# Patient Record
Sex: Female | Born: 1985 | Race: White | Hispanic: No | Marital: Married | Smoking: Never smoker
Health system: Southern US, Community
[De-identification: ages and names within clinical notes are randomized; demographics above are authoritative.]

## PROBLEM LIST (undated history)

## (undated) DIAGNOSIS — I10 Essential (primary) hypertension: Secondary | ICD-10-CM

## (undated) DIAGNOSIS — Z9889 Other specified postprocedural states: Secondary | ICD-10-CM

## (undated) DIAGNOSIS — R112 Nausea with vomiting, unspecified: Secondary | ICD-10-CM

## (undated) HISTORY — PX: LAPAROSCOPIC GASTRIC SLEEVE RESECTION: SHX5895

## (undated) HISTORY — PX: ABDOMINAL HYSTERECTOMY: SHX81

---
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
## 2003-10-30 ENCOUNTER — Encounter: Admission: RE | Admit: 2003-10-30 | Discharge: 2003-10-30 | Payer: Self-pay | Admitting: Sports Medicine

## 2005-12-17 ENCOUNTER — Ambulatory Visit (HOSPITAL_COMMUNITY): Admission: RE | Admit: 2005-12-17 | Discharge: 2005-12-17 | Payer: Self-pay | Admitting: Obstetrics and Gynecology

## 2005-12-17 ENCOUNTER — Encounter (INDEPENDENT_AMBULATORY_CARE_PROVIDER_SITE_OTHER): Payer: Self-pay | Admitting: Specialist

## 2006-07-27 ENCOUNTER — Ambulatory Visit (HOSPITAL_COMMUNITY): Admission: RE | Admit: 2006-07-27 | Discharge: 2006-07-27 | Payer: Self-pay | Admitting: Obstetrics and Gynecology

## 2006-08-24 ENCOUNTER — Inpatient Hospital Stay (HOSPITAL_COMMUNITY): Admission: AD | Admit: 2006-08-24 | Discharge: 2006-08-24 | Payer: Self-pay | Admitting: Obstetrics and Gynecology

## 2006-08-24 ENCOUNTER — Ambulatory Visit (HOSPITAL_COMMUNITY): Admission: RE | Admit: 2006-08-24 | Discharge: 2006-08-24 | Payer: Self-pay | Admitting: Obstetrics and Gynecology

## 2006-09-25 ENCOUNTER — Inpatient Hospital Stay (HOSPITAL_COMMUNITY): Admission: AD | Admit: 2006-09-25 | Discharge: 2006-09-25 | Payer: Self-pay | Admitting: Obstetrics and Gynecology

## 2006-10-24 ENCOUNTER — Inpatient Hospital Stay (HOSPITAL_COMMUNITY): Admission: AD | Admit: 2006-10-24 | Discharge: 2006-10-24 | Payer: Self-pay | Admitting: Obstetrics and Gynecology

## 2006-11-16 ENCOUNTER — Inpatient Hospital Stay (HOSPITAL_COMMUNITY): Admission: AD | Admit: 2006-11-16 | Discharge: 2006-11-16 | Payer: Self-pay | Admitting: Obstetrics and Gynecology

## 2006-11-26 ENCOUNTER — Inpatient Hospital Stay (HOSPITAL_COMMUNITY): Admission: AD | Admit: 2006-11-26 | Discharge: 2006-11-26 | Payer: Self-pay | Admitting: *Deleted

## 2006-11-30 ENCOUNTER — Inpatient Hospital Stay (HOSPITAL_COMMUNITY): Admission: AD | Admit: 2006-11-30 | Discharge: 2006-12-03 | Payer: Self-pay | Admitting: Obstetrics & Gynecology

## 2006-12-05 ENCOUNTER — Encounter: Admission: RE | Admit: 2006-12-05 | Discharge: 2007-01-04 | Payer: Self-pay | Admitting: Obstetrics and Gynecology

## 2006-12-13 ENCOUNTER — Ambulatory Visit: Admission: RE | Admit: 2006-12-13 | Discharge: 2006-12-13 | Payer: Self-pay | Admitting: Obstetrics and Gynecology

## 2007-01-05 ENCOUNTER — Encounter: Admission: RE | Admit: 2007-01-05 | Discharge: 2007-01-19 | Payer: Self-pay | Admitting: Obstetrics and Gynecology

## 2008-04-11 ENCOUNTER — Inpatient Hospital Stay (HOSPITAL_COMMUNITY): Admission: AD | Admit: 2008-04-11 | Discharge: 2008-04-11 | Payer: Self-pay | Admitting: Obstetrics & Gynecology

## 2008-06-14 ENCOUNTER — Inpatient Hospital Stay (HOSPITAL_COMMUNITY): Admission: AD | Admit: 2008-06-14 | Discharge: 2008-06-14 | Payer: Self-pay | Admitting: Obstetrics and Gynecology

## 2008-06-26 ENCOUNTER — Inpatient Hospital Stay (HOSPITAL_COMMUNITY): Admission: AD | Admit: 2008-06-26 | Discharge: 2008-06-26 | Payer: Self-pay | Admitting: Obstetrics

## 2008-07-15 ENCOUNTER — Inpatient Hospital Stay (HOSPITAL_COMMUNITY): Admission: RE | Admit: 2008-07-15 | Discharge: 2008-07-17 | Payer: Self-pay | Admitting: Obstetrics and Gynecology

## 2008-07-18 ENCOUNTER — Encounter: Admission: RE | Admit: 2008-07-18 | Discharge: 2008-08-14 | Payer: Self-pay | Admitting: Obstetrics and Gynecology

## 2008-08-15 ENCOUNTER — Encounter: Admission: RE | Admit: 2008-08-15 | Discharge: 2008-08-19 | Payer: Self-pay | Admitting: Obstetrics and Gynecology

## 2010-06-21 ENCOUNTER — Encounter: Payer: Self-pay | Admitting: Obstetrics and Gynecology

## 2010-09-14 LAB — URINALYSIS, ROUTINE W REFLEX MICROSCOPIC
Hgb urine dipstick: NEGATIVE
Ketones, ur: NEGATIVE mg/dL
pH: 6 (ref 5.0–8.0)

## 2010-09-14 LAB — COMPREHENSIVE METABOLIC PANEL
AST: 20 U/L (ref 0–37)
Albumin: 2.7 g/dL — ABNORMAL LOW (ref 3.5–5.2)
BUN: 5 mg/dL — ABNORMAL LOW (ref 6–23)
Creatinine, Ser: 0.53 mg/dL (ref 0.4–1.2)
Total Bilirubin: 0.4 mg/dL (ref 0.3–1.2)
Total Protein: 6 g/dL (ref 6.0–8.3)

## 2010-09-14 LAB — CBC
MCHC: 32.6 g/dL (ref 30.0–36.0)
MCV: 82.5 fL (ref 78.0–100.0)
RBC: 4.31 MIL/uL (ref 3.87–5.11)
WBC: 11.3 10*3/uL — ABNORMAL HIGH (ref 4.0–10.5)

## 2010-09-14 LAB — URIC ACID: Uric Acid, Serum: 4.7 mg/dL (ref 2.4–7.0)

## 2010-09-15 LAB — COMPREHENSIVE METABOLIC PANEL
Alkaline Phosphatase: 120 U/L — ABNORMAL HIGH (ref 39–117)
Creatinine, Ser: 0.5 mg/dL (ref 0.4–1.2)
GFR calc non Af Amer: >60 mL/min (ref >60)
Potassium: 4.2 mEq/L (ref 3.5–5.1)
Sodium: 135 mEq/L (ref 135–145)

## 2010-09-15 LAB — CBC
Hemoglobin: 11.7 g/dL — ABNORMAL LOW (ref 12.0–15.0)
MCHC: 32.1 g/dL (ref 30.0–36.0)
MCHC: 32.9 g/dL (ref 30.0–36.0)
MCV: 80.4 fL (ref 78.0–100.0)
Platelets: 205 10*3/uL (ref 150–400)
Platelets: 183 10*3/uL (ref 150–400)
RBC: 4.06 MIL/uL (ref 3.87–5.11)
RDW: 15.4 % (ref 11.5–15.5)
WBC: 12 10*3/uL — ABNORMAL HIGH (ref 4.0–10.5)

## 2010-09-15 LAB — RH IMMUNE GLOB WKUP(>/=20WKS)(NOT WOMEN'S HOSP): Fetal Screen: NEGATIVE

## 2010-09-15 LAB — URIC ACID: Uric Acid, Serum: 5.5 mg/dL (ref 2.4–7.0)

## 2010-10-13 NOTE — Consult Note (Signed)
[name], [name]             ACCOUNT NO.:  [id number]   MEDICAL RECORD NO.:  [id number]          PATIENT TYPE:  MAT   LOCATION:  [facility]                          [facility]:  [facility]   PHYSICIAN:  Lenoard Aden, M.D.DATE OF BIRTH:  Aug 06, 1985   DATE OF CONSULTATION:  DATE OF DISCHARGE:                                 CONSULTATION   EMERGENCY ROOM CONSULTATION:   CHIEF COMPLAINT:  Questionable leakage of fluid.   She is a 25 year old white female, G2, P0, at 35 plus weeks who presents  with questionable leakage of fluid noted this past evening.   ALLERGIES:  She has no know drug allergies.   MEDICATIONS:  1. Prenatal vitamins.  2. Diamox for pseudotumor cerebri.   MEDICAL PROBLEMS:  1. Endometriosis.  2. Pseudotumor cerebri which was diagnosed during pregnancy, given her      headaches; blood pressure has not been stable, headaches have      improved.   PRENATAL COURSE:  Complicated by multiple ultrasounds to follow her AFI  which has been normal and elevated blood pressures with no evidence of  preeclampsia.   PHYSICAL EXAMINATION:  GENERAL:  On exam today, she is a well-developed,  well-nourished, white female in no acute distress.  VITAL SIGNS:  Blood pressure is ranging from 145-157/87-93.  Negative  for proteinuria on urine dip.  HEENT:  Normal.  NECK:  Supple.  Full range of motion.  LUNGS:  Clear.  HEART:  Regular rate and rhythm.  ABDOMEN:  Soft, gravid, nontender.  No right upper quadrant tenderness.  EXTREMITIES:  DTRs 2+.  No evidence of clonus.  Trace pretibial edema  noted.  NEUROLOGIC:  Nonfocal.  SKIN:  Intact.  PELVIC:  Cervix is closed, 50%, vertex, -1.  Negative fern, negative  Nitrazine, negative pooling noted.   NST is reactive.  Rare contractions are noted.  PIH panel completely  within normal limits with normal CBC and comprehensive metabolic panel.   IMPRESSION:  1. Thirty-five week intrauterine pregnancy.  2. Pseudotumor cerebri with  persistent elevated blood pressures, no      evidence of preeclampsia.   PLAN:  Discharge home.  Follow up in the office as scheduled.  Preeclampsia precautions given.      Lenoard Aden, M.D.  Electronically Signed     RJT/MEDQ  D:  11/16/2006  T:  11/16/2006  Job:  [id number]

## 2010-10-13 NOTE — Consult Note (Signed)
[name], [name]             ACCOUNT NO.:  [id number]   MEDICAL RECORD NO.:  [id number]          PATIENT TYPE:  MAT   LOCATION:  [facility]                          [facility]   PHYSICIAN:  Lenoard Aden, M.D.DATE OF BIRTH:  09-May-1986   DATE OF CONSULTATION:  DATE OF DISCHARGE:                                 CONSULTATION   CHIEF COMPLAINT:  Headaches and elevated blood pressure as taken at the  fire station.   HISTORY:  She is a 25 year old white female G2, P0 at [redacted] weeks gestation  with a history of elevated blood pressures and pseudotumor cerebri who  presents with increased headache this evening which is frontal and  temporal.  She has a history of headaches from her pseudotumor cerebri  which does not appear recurrent.  She denies nausea or vomiting.  She  denies epigastric pain or any visual changes.  She reports good fetal  movement.  She denies bleeding or leakage of fluid.   ALLERGIES:  SHE HAS NO KNOWN DRUG ALLERGIES.   MEDICATIONS:  Prenatal vitamins and Diazoxide.   PAST MEDICAL HISTORY:  Remarkable for 2 pseudotumor cerebri, history of  kidney stones, childhood asthma, migraines.  She has a history of  laparoscopy x2, history of knee surgery x2.   OBSTETRIC HISTORY:  Miscarriage x1.  She denies domestic or physical  violence.   FAMILY HISTORY:  She has a family history of hypertension,  hypothyroidism, COPD, Parkinson's disease.   PHYSICAL EXAM:  VITAL SIGNS:  Initial blood pressure 167/101, repeat  blood pressure as low as 129/81.  HEENT:  Normal.  LUNGS:  Clear.  HEART:  Regular rate and rhythm.  ABDOMEN:  Soft, gravida, nontender.  No CVA tenderness.  SKIN:  Intact.  NEUROLOGIC EXAM:  Nonfocal.  PELVIC EXAM:  Deferred.  EXTREMITIES:  There are no cords.  No edema.  DTRs 2+.  No evidence of  clonus.  NEUROLOGIC EXAM:  Nonfocal.   LABORATORY DATA:  NST is reactive.  PIH labs within normal limits.   IMPRESSION:  1. Thirty-two-week OB.  2.  History of chronic hypertension versus pregnancy-induced      hypertension.  No evidence of preeclampsia with negative urine,      normal labs and resolution of elevated blood pressure today.   PLAN:  Given Tylenol, discharged home, preeclampsia precautions given,  follow-up in the office as scheduled for weekly amniotic fluid index.      Lenoard Aden, M.D.  Electronically Signed     RJT/MEDQ  D:  10/24/2006  T:  10/24/2006  Job:  [id number]

## 2010-10-13 NOTE — H&P (Signed)
[name], [name]             ACCOUNT NO.:  [id number]   MEDICAL RECORD NO.:  [id number]          PATIENT TYPE:  MAT   LOCATION:  [facility]                          [facility]   PHYSICIAN:  Lenoard Aden, M.D.DATE OF BIRTH:  31-Jan-1986   DATE OF ADMISSION:  06/14/2008  DATE OF DISCHARGE:  06/14/2008                              HISTORY & PHYSICAL   CHIEF COMPLAINT:  Contractions.   She is a 25 year old white female G3, P1, currently at 21 weeks'  gestation, who presents with complaints of contractions and a history of  mildly elevated blood pressure, but no elevation today.   MEDICATIONS:  Prenatal vitamins.   SOCIAL HISTORY:  She is a nonsmoker and nondrinker.  She denies domestic  or physical violence.   FAMILY HISTORY:  Hypertension, thyroid disease, Parkinson disease,  leukemia.   Previous pregnancy, 7-pound 12-ounce child born at term and a history of  FAD in 2007.  She has had laparoscopy x2, hernia surgery, history of  shoulder surgery and history of knee surgery.  Prenatal course of the  baby was uncomplicated.   PHYSICAL EXAMINATION:  GENERAL:  She is a well-developed, well-nourished  white female in no acute distress.  VITAL SIGNS:  Blood pressure 127/80.  HEENT:  Normal.  LUNGS:  Clear.  NECK:  Supple with full range of motion.  HEART:  Regular rate and rhythm.  ABDOMEN:  Soft, gravid, nontender.  No CVA tenderness.  Cervix is  closed, layered, vertex, and -2.  EXTREMITIES:  There are no cords.  NEUROLOGIC:  Nonfocal.  SKIN:  Intact.   NST is reactive.  No regular contractions are noted.  PIH panel drawn  because of a history of mildly elevated blood pressure, was within  normal limits.  Urinalysis was negative.   IMPRESSION:  1. A 35-week OB.  2. No evidence of preterm labor.  3. Headache of unspecified origin, no evidence of preeclampsia.   PLAN:  Given the vitamin x1, discharged home, preterm labor, will follow  up in the office for scheduled  workup.      Lenoard Aden, M.D.  Electronically Signed     RJT/MEDQ  D:  06/16/2008  T:  06/16/2008  Job:  [id number]

## 2010-10-13 NOTE — H&P (Signed)
[name], [name]             ACCOUNT NO.:  [id number]   MEDICAL RECORD NO.:  [id number]          PATIENT TYPE:  INP   LOCATION:  9108                          [facility]   PHYSICIAN:  Lenoard Aden, M.D.DATE OF BIRTH:  08-28-1985   DATE OF ADMISSION:  07/15/2008  DATE OF DISCHARGE:                              HISTORY & PHYSICAL   CHIEF COMPLAINT:  A 39 weeks pseudotumor cerebri with new-onset  worsening headaches for induction of labor favorable cervix.   HISTORY OF PRESENT ILLNESS:  She is a 25 year old white female G3, P1 at  39 weeks with a history of pseudotumor cerebri, currently on no  medications who presents now 39 weeks with favorable cervix for  induction.  She has no known drug allergies, medications, or prenatal  vitamins.  She is a nonsmoker, nondrinker.  She denies domestic or  physical violence.   MEDICAL PROBLEMS:  Pseudotumor cerebri as noted, kidney stones, optic  nerve swelling, low-grade SIL, asthma, migraine headaches, and history  of endometriosis.   FAMILY HISTORY:  Hypertension, COPD, thyroid disease, Parkinson's,  kidney stones, lymphoma, leukemia, history of 7 pounds 12 ounces female,  and 4-week SAB.   PHYSICAL EXAMINATION:  GENERAL:  She is a well-developed, well-nourished  white female in no acute distress.  HEENT:  Normal.  LUNGS:  Clear.  HEART:  Regular rhythm.  ABDOMEN:  Soft, gravid, nontender.  Estimated fetal weight of 8 pounds.  Cervix is 2-3 cm, 70% vertex -1.  EXTREMITIES:  No cords.  NEUROLOGIC:  Nonfocal.  SKIN:  Intact.   IMPRESSION:  1. A 39-week intrauterine pregnancy.  2. Symptomatic pseudotumor cerebri.  3. Elevated blood pressure due to symptomatic pseudotumor cerebri.  4. No evidence of preeclampsia.   PLAN:  Proceed with induction.      Lenoard Aden, M.D.  Electronically Signed    RJT/MEDQ  D:  07/15/2008  T:  07/16/2008  Job:  [id number]

## 2010-10-16 NOTE — Consult Note (Signed)
NAMEZONDRA, LAWLOR NO.:  [id number]   MEDICAL RECORD NO.:  [id number]         PATIENT TYPE:  [facility]   LOCATION:                                [facility]   PHYSICIAN:  Lenoard Aden, M.D.     DATE OF BIRTH:   DATE OF CONSULTATION:  09/25/2006  DATE OF DISCHARGE:                                 CONSULTATION   CHIEF COMPLAINT:  This is a 25 year old white female, G2, P0 with a  history of pseudotumor cerebri currently on Diamox with pregnancy who  presents for followup __________  last 5 hours.   Hypothyroidism, Parkinson's disease, __________.   She has no known drug allergies.   Prenatal course was complicated by diagnosis of pseudotumor cerebri and  started on Diamox.   Today her blood pressure is 120/60 after being 140/54.  HEENT:  Normal.  NECK:  Supple.  Full range of motion.  LUNGS: Clear.  HEART:  Regular rate and rhythm.  ABDOMEN:  Soft, gravid, nontender.  CERVICAL:  Exam is deferred.  EXTREMITIES:  No cords.  NEUROLOGIC:  Nonfocal.  SKIN:  Intact.   NST:  Reassuring.  __________. No contractions, no decelerations noted.   IMPRESSION:  1. A 27-week pregnancy.  2. Reassuring fetal heart rate with notable fetal movement while      auscultating fetal heart beat.  3. Borderline hypertension. Stable.   PLAN:  Follow up in the office as scheduled.      Lenoard Aden, M.D.  Electronically Signed     RJT/MEDQ  D:  09/25/2006  T:  09/25/2006  Job:  [id number]

## 2010-10-16 NOTE — Op Note (Signed)
NAMESHAVONA, GUNDERMAN             ACCOUNT NO.:  [id number]   MEDICAL RECORD NO.:  [id number]          PATIENT TYPE:  AMB   LOCATION:  [facility]                           [facility]   PHYSICIAN:  Lenoard Aden, M.D.DATE OF BIRTH:  June 04, 1985   DATE OF PROCEDURE:  12/17/2005  DATE OF DISCHARGE:                                 OPERATIVE REPORT   PREOPERATIVE DIAGNOSIS:  Right lower quadrant pain.   POSTOPERATIVE DIAGNOSIS:  Pelvic endometriosis.   PROCEDURE:  Diagnostic laparoscopy ablation of cul de sac endometriosis and  endometriosis in the right and left ovarian fossa.   SURGEON:  Lenoard Aden, M.D.   ANESTHESIA:  General.   ESTIMATED BLOOD LOSS:  50 cc.   COMPLICATIONS:  None.   DRAINS:  None.   COUNTS:  Correct.   Patient to recovery room in good condition.   OPERATIVE NOTE:  After being apprised of the risks of anesthesia, infection,  and bleeding, injury to abdominal organs, need for repair, delayed  __________ including bowel and bladder injury, patient taken to the  operating room.  She is administered a general anesthetic without  complications, prepped and draped in the usual sterile fashion.  Foley  catheter placed.  After achieving adequate anesthesia, dilute Marcaine  solution placed.  An infraumbilical incision made with a scalpel after a  Hulka tenaculum placed per vagina.  After achievement of an infraumbilical  incision, Veress needle placed.  Opening pressure -1 noted.  Four liters of  CO2 insufflated without difficulty.  Trocar placed.  Atraumatic trocar entry  visualized.  Normal liver, gallbladder bed.  Normal appendiceal area.  Normal uterus.  Normal anterior cul de sac.  Normal left and right ovary.  Normal tubes.  Posterior cul de sac has multiple surface endometrial  implants, mostly clear with some reddish, powdery burn-type implants as  well.  Ureters are identified bilaterally.  A second 5 mm set is entered in  the left lower quadrant.   All implants are visualized and under direct  visualization are cauterized bipolar with good resolution of the implants  along the posterior cul de sac, uterosacral ligaments, right and left  ovarian fossa with special attention paid to the ureters during the process  at this time.  Good, adequate ablation of all implants is noted.  Good  hemostasis noted.  All  instruments are removed under direct visualization.  CO2 released.  The  incision is closed using 0 Vicryl and Dermabond.  The patient tolerated the  procedure well.  The instruments are removed from the vagina.  The Foley  removed.  Patient is transferred to the recovery room in good condition.      Lenoard Aden, M.D.  Electronically Signed     RJT/MEDQ  D:  12/17/2005  T:  12/17/2005  Job:  [id number]

## 2010-10-16 NOTE — Consult Note (Signed)
Tamara [name], [name]             ACCOUNT NO.:  [id number]   MEDICAL RECORD NO.:  [id number]          PATIENT TYPE:  MAT   LOCATION:  [facility]                          [facility]   PHYSICIAN:  Richardean Sale, M.D.   DATE OF BIRTH:  November 18, 1985   DATE OF CONSULTATION:  08/24/2006  DATE OF DISCHARGE:                                 CONSULTATION   CHIEF COMPLAINT:  Headache and elevated blood pressure.   HISTORY OF PRESENT ILLNESS:  This is a 25 year old, gravida 2, para 0-0-  1-0,  white female who is at [redacted] weeks gestation with a due date of December 18, 2006, who was seen this morning at the [facility]  center for an ultrasound and was found to have a blood pressure of  160/110.  The patient complained of a headache and was subsequently sent  to maternity admissions for evaluation.  The patient's pregnancy is  complicated by pseudotumor cerebri.  She has had significant headaches  as well as double vision during this pregnancy.  She has been seen by an  ophthalmologist in [place] and is currently on Diamox.  The  patient is undergoing weekly ultrasound for amniotic fluid volume  through the center for maternal fetal care.  The patient states this  headache is on the left side.  It is not like the headache she has had  from the pseudotumor cerebri, but could possibly be a migraine.  She  denies any nausea or vomiting.  No epigastric pain and no new visual  changes.  She reports good movement, denies any loss of fluid or vaginal  bleeding.  Complains of a little bit of pelvic cramping but no  contractions.   Pren[facility] care has been at [facility] with Dr. Billy Coast as primary  attending.   PAST MEDICAL HISTORY:  1. Pseudotumor cerebri.  2. History of kidney stone.  3. Childhood asthma.  4. Migraines.   PAST SURGICAL HISTORY:  1. Laparoscopy x2.  2. Knee surgery x2.   GYNECOLOGIC HISTORY:  1. Endometriosis.  2. Low-grade dysplasia on Pap smear, no treatment  needed.   Denies any sexually transmitted infections.   OBSTETRIC HISTORY:  Miscarriage x1.   SOCIAL HISTORY:  She is married.  Denies alcohol, tobacco, or drug use.   FAMILY HISTORY:  Positive for chronic hypertension, COPD,  hypothyroidism, Parkinson's disease.  No birth defects, congenital  anomalies, Down's syndrome, or spina bifida.   MEDICATIONS:  Diamox 1000 mg daily and prenatal vitamins.   ALLERGIES:  No known drug allergies.   PHYSICAL EXAMINATION:  VITAL SIGNS:  She is afebrile.  Vital signs ae  stable.  Blood pressure 148/84 upon arrival to maternity admissions.  GENERAL:  She is a well-developed, well-nourished white female who  appears in no acute distress.  HEART:  Regular rate and rhythm.  LUNGS:  Clear to auscultation bilaterally.  ABDOMEN:  Gravid, soft, nontender, appears AGA.  EXTREMITIES:  No cyanosis, clubbing, or edema.  Nontender.  NEUROLOGIC:  Exam is nonfocal.  Deep tendon reflexes are 1+ bilaterally  with no clonus.   ASSESSMENT:  A 25 year old  white female at [redacted] weeks gestation with known  pseudotumor cerebri, now with elevation of blood pressure.   PLAN:  1. Will check preeclampsia labs and urinalysis for protein as well as      serial blood pressures with admission pending results.  2. Pseudotumor cerebri.  Continue Diamox 1000 mg daily and check      ultrasound today for amniotic fluid volume.      Richardean Sale, M.D.  Electronically Signed     JW/MEDQ  D:  08/24/2006  T:  08/24/2006  Job:  [id number]

## 2010-10-16 NOTE — Op Note (Signed)
[name], [name]             ACCOUNT NO.:  [id number]   MEDICAL RECORD NO.:  [id number]          PATIENT TYPE:  AMB   LOCATION:  [facility]                           [facility]   PHYSICIAN:  Lenoard Aden, M.D.DATE OF BIRTH:  April 22, 1986   DATE OF PROCEDURE:  12/17/2005  DATE OF DISCHARGE:  12/17/2005                                 OPERATIVE REPORT   ADDENDUM:  Please add to the:   PREOPERATIVE DIAGNOSIS:  Desire for intrauterine device removal.   POSTOPERATIVE DIAGNOSIS:  Desire for intrauterine device removal.   PROCEDURE:  Intrauterine device removal.   ADDENDUM TO THE PROCEDURE NOTE:  After achieving adequate anesthesia, the  patient was prepped and draped in the usual sterile fashion.  IUD string was  visualized and removed.  A Mirena IUD was removed without difficulty.  The  patient tolerated the procedure well under anesthesia and the rest of the  operation was performed as previously dictated.      Lenoard Aden, M.D.  Electronically Signed     RJT/MEDQ  D:  02/21/2006  T:  02/23/2006  Job:  [id number]

## 2011-03-16 LAB — CBC
HCT: 33.7 — ABNORMAL LOW
Hemoglobin: 11.3 — ABNORMAL LOW
Hemoglobin: 12.8
MCHC: 33
MCHC: 34.3
MCV: 83.4
Platelets: 220
Platelets: 235
Platelets: 258
RDW: 13.8
WBC: 11.3 — ABNORMAL HIGH
WBC: 20 — ABNORMAL HIGH

## 2011-03-16 LAB — COMPREHENSIVE METABOLIC PANEL
ALT: 15
Albumin: 2.2 — ABNORMAL LOW
BUN: 6
CO2: 22
Calcium: 8.9
GFR calc Af Amer: >60
GFR calc non Af Amer: >60
Potassium: 2.9 — ABNORMAL LOW
Total Bilirubin: 0.5
Total Protein: 5.1 — ABNORMAL LOW

## 2011-03-16 LAB — LACTATE DEHYDROGENASE: LDH: 162

## 2011-03-16 LAB — CCBB MATERNAL DONOR DRAW

## 2011-03-17 LAB — URINALYSIS, ROUTINE W REFLEX MICROSCOPIC
Bilirubin Urine: NEGATIVE
Glucose, UA: NEGATIVE
Ketones, ur: NEGATIVE
Nitrite: NEGATIVE
Protein, ur: NEGATIVE
pH: 5.5

## 2011-03-17 LAB — COMPREHENSIVE METABOLIC PANEL
Albumin: 2.8 — ABNORMAL LOW
BUN: 8
Calcium: 9.2
Glucose, Bld: 85
Total Protein: 6.3

## 2011-03-17 LAB — CBC
HCT: 38.3
Hemoglobin: 13.1
MCHC: 34.3
Platelets: 239
RDW: 13.7

## 2011-03-17 LAB — LACTATE DEHYDROGENASE: LDH: 141

## 2011-03-17 LAB — URIC ACID: Uric Acid, Serum: 4.7

## 2013-08-07 ENCOUNTER — Other Ambulatory Visit: Payer: Self-pay | Admitting: Orthopedic Surgery

## 2013-08-07 DIAGNOSIS — M25561 Pain in right knee: Secondary | ICD-10-CM

## 2013-08-12 ENCOUNTER — Other Ambulatory Visit: Payer: Self-pay

## 2017-09-12 ENCOUNTER — Encounter: Payer: Self-pay | Admitting: *Deleted

## 2017-09-12 ENCOUNTER — Emergency Department
Admission: EM | Admit: 2017-09-12 | Discharge: 2017-09-12 | Disposition: A | Discharge status: Home / Self Care | Payer: BLUE CROSS/BLUE SHIELD | Source: Home / Self Care | Attending: Family Medicine | Admitting: Family Medicine

## 2017-09-12 ENCOUNTER — Other Ambulatory Visit: Payer: Self-pay

## 2017-09-12 ENCOUNTER — Emergency Department (INDEPENDENT_AMBULATORY_CARE_PROVIDER_SITE_OTHER): Payer: BLUE CROSS/BLUE SHIELD

## 2017-09-12 DIAGNOSIS — M5442 Lumbago with sciatica, left side: Secondary | ICD-10-CM

## 2017-09-12 DIAGNOSIS — M5416 Radiculopathy, lumbar region: Secondary | ICD-10-CM

## 2017-09-12 MED ORDER — PREDNISONE 20 MG PO TABS: ORAL_TABLET | ORAL | 0 refills | Status: DC

## 2017-09-12 MED ORDER — KETOROLAC TROMETHAMINE 60 MG/2ML IM SOLN
60.0000 mg | Freq: Once | INTRAMUSCULAR | Status: AC
  Administered 2017-09-12: 60 mg via INTRAMUSCULAR

## 2017-09-12 MED ORDER — HYDROCODONE-ACETAMINOPHEN 5-325 MG PO TABS: 1.0000 | ORAL_TABLET | Freq: Four times a day (QID) | ORAL | 0 refills | Status: AC | PRN

## 2017-09-12 MED ORDER — CYCLOBENZAPRINE HCL 10 MG PO TABS: 10.0000 mg | ORAL_TABLET | Freq: Two times a day (BID) | ORAL | 1 refills | Status: DC | PRN

## 2017-09-12 NOTE — ED Triage Notes (Signed)
Patient reports yesterday while doing laundry in a twisting motion injured her lower left back with pain in her left glute and leg. No previous injury. Taking tylenol without relief.

## 2017-09-12 NOTE — Discharge Instructions (Addendum)
Apply ice pack for 20 to 30 minutes, 3 to 4 times daily  Continue until pain and swelling decrease.  Begin range of motion and stretching exercises as tolerated. 

## 2017-09-12 NOTE — ED Provider Notes (Signed)
[name]    CSN: [id number] Arrival date & time: 09/12/17  0827     History   Chief Complaint Chief Complaint  Patient presents with  . Back Pain    HPI [name] is a 32 y.o. female.   While doing laundry yesterday, patient twisted her back and felt sudden sharp pain in her left lower back that radiated to her left lateral thigh.  The pain has persisted.   She denies bowel or bladder dysfunction, and no saddle numbness.  She has a past history of cervical spine degenerative changes.  The history is provided by the patient and the spouse.  Back Pain  Location:  Sacro-iliac joint Quality:  Aching, shooting and stiffness Stiffness is present:  All day Radiates to:  L thigh Pain severity:  Severe Pain is:  Same all the time Onset quality:  Sudden Duration:  1 day Timing:  Constant Progression:  Unchanged Chronicity:  New Context: lifting heavy objects and twisting   Context: not recent illness and not recent injury   Relieved by:  Nothing Worsened by:  Ambulation and bending Ineffective treatments:  Heating pad and OTC medications Associated symptoms: no abdominal pain, no abdominal swelling, no bladder incontinence, no bowel incontinence, no chest pain, no dysuria, no fever, no leg pain, no numbness, no paresthesias, no pelvic pain, no perianal numbness, no tingling, no weakness and no weight loss     History reviewed. No pertinent past medical history.  There are no active problems to display for this patient.   Past Surgical History:  Procedure Laterality Date  . ABDOMINAL HYSTERECTOMY    . LAPAROSCOPIC GASTRIC SLEEVE RESECTION      OB History   None      Home Medications    Prior to Admission medications   Medication Sig Start Date End Date Taking? Authorizing Provider  cyanocobalamin (,VITAMIN B-12,) 1000 MCG/ML injection Inject into the muscle. 04/01/15  Yes [provider]  Multiple Vitamin (MULTIVITAMIN) tablet Take  1 tablet by mouth daily.   Yes [provider]  cyclobenzaprine (FLEXERIL) 10 MG tablet Take 1 tablet (10 mg total) by mouth 2 (two) times daily as needed for muscle spasms. 09/12/17   Kandra Nicolas, MD  HYDROcodone-acetaminophen (NORCO/VICODIN) 5-325 MG tablet Take 1 tablet by mouth every 6 (six) hours as needed for up to 5 days for moderate pain. 09/12/17 09/17/17  Kandra Nicolas, MD  predniSONE (DELTASONE) 20 MG tablet Take one tab by mouth twice daily for 4 days, then one daily for 3 days. Take with food. 09/12/17   Kandra Nicolas, MD    Family History Family History  Problem Relation Age of Onset  . Hypertension Mother   . Hypertension Father     Social History Social History   Tobacco Use  . Smoking status: Never Smoker  . Smokeless tobacco: Never Used  Substance Use Topics  . Alcohol use: Never    Frequency: Never  . Drug use: Never     Allergies   Aspartame and Ibuprofen   Review of Systems Review of Systems  Constitutional: Negative for fever and weight loss.  Cardiovascular: Negative for chest pain.  Gastrointestinal: Negative for abdominal pain and bowel incontinence.  Genitourinary: Negative for bladder incontinence, dysuria and pelvic pain.  Musculoskeletal: Positive for back pain.  Neurological: Negative for tingling, weakness, numbness and paresthesias.  All other systems reviewed and are negative.    Physical Exam Triage Vital Signs ED Triage Vitals  Enc Vitals Group     BP      Pulse      Resp      Temp      Temp src      SpO2      Weight      Height      Head Circumference      Peak Flow      Pain Score      Pain Loc      Pain Edu?      Excl. in [place]?    No data found.  Updated Vital Signs BP 131/89 (BP Location: Right Arm)   Pulse 96   Ht 5\' 4"  (1.626 m)   Wt 142 lb (64.4 kg)   SpO2 100%   BMI 24.37 kg/m   Visual Acuity Right Eye Distance:   Left Eye Distance:   Bilateral Distance:    Right Eye Near:   Left Eye  Near:    Bilateral Near:     Physical Exam  Constitutional: She appears well-developed and well-nourished. No distress.  HENT:  Head: Normocephalic.  Eyes: Pupils are equal, round, and reactive to light. Conjunctivae are normal.  Neck: Normal range of motion.  Cardiovascular: Normal heart sounds.  Pulmonary/Chest: Breath sounds normal.  Abdominal: There is no tenderness.  Musculoskeletal: She exhibits no edema.       Lumbar back: She exhibits decreased range of motion, tenderness and bony tenderness. She exhibits no swelling.       Back:  Back:   Can heel/toe walk and squat without difficulty.  Decreased range of motion.  Tenderness over the left SI joint. Straight leg raising test is positive on the left at about 30 degrees.  Sitting knee extension test is positive on the left at full knee extension.  Strength and sensation in the lower extremities is normal.  Patellar and achilles reflexes are normal   Neurological: She is alert.  Skin: Skin is warm and dry.  Nursing note and vitals reviewed.    UC Treatments / Results  Labs (all labs ordered are listed, but only abnormal results are displayed) Labs Reviewed - No data to display  EKG None Radiology Dg Lumbar Spine Complete  Result Date: 09/12/2017 CLINICAL DATA:  Acute onset low back pain on the left radiating into the left leg while doing laundry yesterday. No known injury. EXAM: LUMBAR SPINE - COMPLETE 4+ VIEW COMPARISON:  None. FINDINGS: There is no evidence of lumbar spine fracture. Alignment is normal. Intervertebral disc spaces are maintained. IMPRESSION: Normal exam. Electronically Signed   By: Inge Rise M.D.   On: 09/12/2017 09:30    Procedures Procedures (including critical care time)  Medications Ordered in UC Medications  ketorolac (TORADOL) injection 60 mg (60 mg Intramuscular Given 09/12/17 0903)     Initial Impression / Assessment and Plan / UC Course  I have reviewed the triage vital signs and the  nursing notes.  Pertinent labs & imaging results that were available during my care of the patient were reviewed by me and considered in my medical decision making (see chart for details).    Begin prednisone burst/taper, Flexeril, and Lortab. Controlled Substance Prescriptions I have consulted the [place] Controlled Substances Registry for this patient, and feel the risk/benefit ratio today is favorable for proceeding with this prescription for a controlled substance.  Apply ice pack for 20 to 30 minutes, 3 to 4 times daily  Continue until pain and swelling decrease.  Begin range of  motion and stretching exercises as tolerated. Followup with Dr. Aundria Mems or Dr. Lynne Leader ([facility]) if not improving about two weeks.     Final Clinical Impressions(s) / UC Diagnoses   Final diagnoses:  Acute left-sided low back pain with left-sided sciatica    ED Discharge Orders        Ordered    HYDROcodone-acetaminophen (NORCO/VICODIN) 5-325 MG tablet  Every 6 hours PRN     09/12/17 1003    cyclobenzaprine (FLEXERIL) 10 MG tablet  2 times daily PRN     09/12/17 1003    predniSONE (DELTASONE) 20 MG tablet     09/12/17 1003          Kandra Nicolas, MD 09/12/17 1008

## 2019-11-09 ENCOUNTER — Encounter (HOSPITAL_BASED_OUTPATIENT_CLINIC_OR_DEPARTMENT_OTHER): Payer: Self-pay | Admitting: Orthopedic Surgery

## 2019-11-09 ENCOUNTER — Ambulatory Visit (INDEPENDENT_AMBULATORY_CARE_PROVIDER_SITE_OTHER): Payer: BC Managed Care – PPO | Admitting: Orthopedic Surgery

## 2019-11-09 ENCOUNTER — Other Ambulatory Visit: Payer: Self-pay

## 2019-11-09 ENCOUNTER — Encounter: Payer: Self-pay | Admitting: Orthopedic Surgery

## 2019-11-09 DIAGNOSIS — M25862 Other specified joint disorders, left knee: Secondary | ICD-10-CM

## 2019-11-09 NOTE — Progress Notes (Signed)
Office Visit Note   Patient: [name]           Date of Birth: [birth year]           MRN: [id number] Visit Date: 11/09/2019 Requested by: Louis Meckel, PA-C [place],  [place] [id number] PCP: Louis Meckel, PA-C  Subjective: Chief Complaint  Patient presents with  . Left Knee - Pain    HPI: [name] is a 34 year old patient with left knee pain.'s been going on for 2 years.  Denies any specific injury.  Been much worse over the past 4 months.  She has real difficulty and pain with flexion anywhere near 90 degrees.  Describes the pain is level 8 out of 10.  Used to bike 55 miles a week but she has unable to do that since February.  She has had 2 cortisone shots one this year one last year.  They did not help.  Both cortisone injections were performed from anterior medial approach.  Denies any groin pain or back pain.  Denies any numbness and tingling.  Localizes the pain just on the medial aspect of the patellar tendon.  Does report some stiffness at the end of the day.  She has tried Voltaren gel as well as CBD cream and Tylenol and ibuprofen.  Also tried physical therapy which out any relief.  She has had right knee ACL reconstruction and some type of medial patella stabilizing procedure and lateral release on the right-hand side.  She states that this pain in the knee does not feel like any of the pathology she has experienced before with her ACL tear and patellar instability.  MRI scan accompanies the patient.  It is reviewed frame by frame coronal sagittal and axial views.  Menisci look intact.  Joint surfaces look intact.  ACL and PCL look intact.  There is abnormality of the retropatellar fat pad noted most prominently on the T2 sagittals.  It appears that there may be a focal lipoma or well-circumscribed soft tissue trapezoidal shape tissue in the intercondylar notch.  Fat density is the same as the fat pad on the T1 sagittals.  There is  definite well-circumscribed fatty lesion within the retrofat pad seen on the T2 sagittals.              ROS: All systems reviewed are negative as they relate to the chief complaint within the history of present illness.  Patient denies  fevers or chills.   Assessment & Plan: Visit Diagnoses:  1. Cyst of left knee joint     Plan: Impression is pretty debilitating right knee pain with no evidence of referred pain from the back or hip.  She does have on MRI review a potential abnormality in the retropatellar fat pad.  This is not definite and was not mentioned by the radiologist and all this is discussed with her.  Nonetheless I think this is really the only thing that makes potentially any sense with her clinical presentation.  Discussed with her operative and nonoperative treatment options for this problem.  She has really exhausted nonoperative treatment options.  I am not sure that with that approach for an injection anteromedial that the medication was delivered into the joint.  This is particularly true based on her pretty significant pain for 4 to 5 days after the injection.  Nonetheless I think arthroscopy and retropatellar debridement as well as tissue sampling is indicated.  Nothing really about this appears malignant based  on the scans.  The risk benefits of the procedure are discussed including the potential for no improvement.  Patient understands and wishes to proceed.  All questions answered no personal or family history of DVT or pulmonary embolism.  Follow-Up Instructions: No follow-ups on file.   Orders:  No orders of the defined types were placed in this encounter.  No orders of the defined types were placed in this encounter.     Procedures: No procedures performed   Clinical Data: No additional findings.  Objective: Vital Signs: There were no vitals taken for this visit.  Physical Exam:   Constitutional: Patient appears well-developed HEENT:  Head:  Normocephalic Eyes:EOM are normal Neck: Normal range of motion Cardiovascular: Normal rate Pulmonary/chest: Effort normal Neurologic: Patient is alert Skin: Skin is warm Psychiatric: Patient has normal mood and affect    Ortho Exam: Ortho exam demonstrates full active and passive range of motion of the right knee.  Left knee demonstrates pain with flexion past 90.  Collateral cruciate ligaments are stable.  Does have tenderness to palpation of the fat pad on the medial aspect of the patellar tendon.  Patella tracks normally without apprehension.  No effusion in the left knee.  Pedal pulse palpable.  No proximal lymphadenopathy.  Specialty Comments:  No specialty comments available.  Imaging: No results found.   PMFS History: There are no problems to display for this patient.  Past Medical History:  Diagnosis Date  . Hypertension   . PONV (postoperative nausea and vomiting)     Family History  Problem Relation Age of Onset  . Hypertension Mother   . Hypertension Father     Past Surgical History:  Procedure Laterality Date  . ABDOMINAL HYSTERECTOMY    . LAPAROSCOPIC GASTRIC SLEEVE RESECTION     Social History   Occupational History  . Not on file  Tobacco Use  . Smoking status: Never Smoker  . Smokeless tobacco: Never Used  Substance and Sexual Activity  . Alcohol use: Never  . Drug use: Never  . Sexual activity: Not on file

## 2019-11-12 ENCOUNTER — Other Ambulatory Visit (HOSPITAL_COMMUNITY)
Admission: RE | Admit: 2019-11-12 | Discharge: 2019-11-12 | Disposition: A | Discharge status: Home / Self Care | Payer: BC Managed Care – PPO | Source: Ambulatory Visit | Attending: Orthopedic Surgery | Admitting: Orthopedic Surgery

## 2019-11-12 ENCOUNTER — Encounter (HOSPITAL_BASED_OUTPATIENT_CLINIC_OR_DEPARTMENT_OTHER)
Admission: RE | Admit: 2019-11-12 | Discharge: 2019-11-12 | Disposition: A | Discharge status: Home / Self Care | Payer: BC Managed Care – PPO | Source: Ambulatory Visit | Attending: Orthopedic Surgery | Admitting: Orthopedic Surgery

## 2019-11-12 DIAGNOSIS — Z01812 Encounter for preprocedural laboratory examination: Secondary | ICD-10-CM | POA: Diagnosis present

## 2019-11-12 DIAGNOSIS — Z20822 Contact with and (suspected) exposure to covid-19: Secondary | ICD-10-CM | POA: Diagnosis not present

## 2019-11-12 DIAGNOSIS — Z01818 Encounter for other preprocedural examination: Secondary | ICD-10-CM | POA: Insufficient documentation

## 2019-11-12 LAB — BASIC METABOLIC PANEL
Anion gap: 9 (ref 5–15)
BUN: 11 mg/dL (ref 6–20)
CO2: 22 mmol/L (ref 22–32)
Calcium: 9.1 mg/dL (ref 8.9–10.3)
Chloride: 106 mmol/L (ref 98–111)
Creatinine, Ser: 0.86 mg/dL (ref 0.44–1.00)
GFR calc Af Amer: >60 mL/min (ref >60)
GFR calc non Af Amer: >60 mL/min (ref >60)
Glucose, Bld: 90 mg/dL (ref 70–99)
Potassium: 3.9 mmol/L (ref 3.5–5.1)
Sodium: 137 mmol/L (ref 135–145)

## 2019-11-12 LAB — CBC
HCT: 44.2 % (ref 36.0–46.0)
Hemoglobin: 15.2 g/dL — ABNORMAL HIGH (ref 12.0–15.0)
MCH: 28.3 pg (ref 26.0–34.0)
MCHC: 34.4 g/dL (ref 30.0–36.0)
MCV: 82.3 fL (ref 80.0–100.0)
Platelets: 370 10*3/uL (ref 150–400)
RBC: 5.37 MIL/uL — ABNORMAL HIGH (ref 3.87–5.11)
RDW: 13.3 % (ref 11.5–15.5)
WBC: 6.2 10*3/uL (ref 4.0–10.5)
nRBC: 0 % (ref 0.0–0.2)

## 2019-11-12 LAB — SARS CORONAVIRUS 2 (TAT 6-24 HRS): SARS Coronavirus 2: NEGATIVE

## 2019-11-12 NOTE — Progress Notes (Signed)

## 2019-11-13 ENCOUNTER — Other Ambulatory Visit: Payer: Self-pay

## 2019-11-15 ENCOUNTER — Other Ambulatory Visit: Payer: Self-pay

## 2019-11-15 ENCOUNTER — Encounter: Payer: Self-pay | Admitting: Orthopedic Surgery

## 2019-11-15 ENCOUNTER — Ambulatory Visit (HOSPITAL_BASED_OUTPATIENT_CLINIC_OR_DEPARTMENT_OTHER)
Admission: RE | Admit: 2019-11-15 | Discharge: 2019-11-15 | Disposition: A | Discharge status: Home / Self Care | Payer: BC Managed Care – PPO | Attending: Orthopedic Surgery | Admitting: Orthopedic Surgery

## 2019-11-15 ENCOUNTER — Ambulatory Visit (HOSPITAL_BASED_OUTPATIENT_CLINIC_OR_DEPARTMENT_OTHER): Payer: BC Managed Care – PPO | Admitting: Certified Registered Nurse Anesthetist

## 2019-11-15 ENCOUNTER — Encounter (HOSPITAL_BASED_OUTPATIENT_CLINIC_OR_DEPARTMENT_OTHER): Payer: Self-pay | Admitting: Orthopedic Surgery

## 2019-11-15 ENCOUNTER — Encounter (HOSPITAL_BASED_OUTPATIENT_CLINIC_OR_DEPARTMENT_OTHER)
Admission: RE | Disposition: A | Discharge status: Home / Self Care | Payer: Self-pay | Source: Home / Self Care | Attending: Orthopedic Surgery

## 2019-11-15 DIAGNOSIS — Z8249 Family history of ischemic heart disease and other diseases of the circulatory system: Secondary | ICD-10-CM | POA: Insufficient documentation

## 2019-11-15 DIAGNOSIS — Z79899 Other long term (current) drug therapy: Secondary | ICD-10-CM | POA: Diagnosis not present

## 2019-11-15 DIAGNOSIS — M94262 Chondromalacia, left knee: Secondary | ICD-10-CM | POA: Diagnosis not present

## 2019-11-15 DIAGNOSIS — I10 Essential (primary) hypertension: Secondary | ICD-10-CM | POA: Diagnosis not present

## 2019-11-15 DIAGNOSIS — D1779 Benign lipomatous neoplasm of other sites: Secondary | ICD-10-CM | POA: Insufficient documentation

## 2019-11-15 DIAGNOSIS — M6752 Plica syndrome, left knee: Secondary | ICD-10-CM

## 2019-11-15 DIAGNOSIS — Z90710 Acquired absence of both cervix and uterus: Secondary | ICD-10-CM | POA: Insufficient documentation

## 2019-11-15 DIAGNOSIS — Z886 Allergy status to analgesic agent status: Secondary | ICD-10-CM | POA: Diagnosis not present

## 2019-11-15 DIAGNOSIS — Z9884 Bariatric surgery status: Secondary | ICD-10-CM | POA: Insufficient documentation

## 2019-11-15 DIAGNOSIS — Z888 Allergy status to other drugs, medicaments and biological substances status: Secondary | ICD-10-CM | POA: Diagnosis not present

## 2019-11-15 HISTORY — DX: Other specified postprocedural states: R11.2

## 2019-11-15 HISTORY — DX: Other specified postprocedural states: Z98.890

## 2019-11-15 HISTORY — DX: Essential (primary) hypertension: I10

## 2019-11-15 HISTORY — PX: KNEE ARTHROSCOPY: SHX127

## 2019-11-15 SURGERY — ARTHROSCOPY, KNEE
Anesthesia: General | Site: Knee | Laterality: Left

## 2019-11-15 MED ORDER — FENTANYL CITRATE (PF) 100 MCG/2ML IJ SOLN
INTRAMUSCULAR | Status: AC
  Filled 2019-11-15: qty 2

## 2019-11-15 MED ORDER — FENTANYL CITRATE (PF) 100 MCG/2ML IJ SOLN
50.0000 ug | INTRAMUSCULAR | Status: DC | PRN
  Administered 2019-11-15: 100 ug via INTRAVENOUS

## 2019-11-15 MED ORDER — ONDANSETRON HCL 4 MG/2ML IJ SOLN
4.0000 mg | Freq: Once | INTRAMUSCULAR | Status: AC | PRN
  Administered 2019-11-15: 4 mg via INTRAVENOUS

## 2019-11-15 MED ORDER — SCOPOLAMINE 1 MG/3DAYS TD PT72
MEDICATED_PATCH | TRANSDERMAL | Status: AC
  Filled 2019-11-15: qty 1

## 2019-11-15 MED ORDER — ONDANSETRON HCL 4 MG/2ML IJ SOLN
INTRAMUSCULAR | Status: AC
  Filled 2019-11-15: qty 2

## 2019-11-15 MED ORDER — MORPHINE SULFATE (PF) 4 MG/ML IV SOLN
INTRAVENOUS | Status: AC
  Filled 2019-11-15: qty 1

## 2019-11-15 MED ORDER — POVIDONE-IODINE 7.5 % EX SOLN
Freq: Once | CUTANEOUS | Status: DC
  Filled 2019-11-15: qty 118

## 2019-11-15 MED ORDER — CLONIDINE HCL (ANALGESIA) 100 MCG/ML EP SOLN
EPIDURAL | Status: DC | PRN
  Administered 2019-11-15: .75 mL

## 2019-11-15 MED ORDER — CEFAZOLIN SODIUM-DEXTROSE 2-4 GM/100ML-% IV SOLN
INTRAVENOUS | Status: AC
  Filled 2019-11-15: qty 100

## 2019-11-15 MED ORDER — PROPOFOL 10 MG/ML IV BOLUS
INTRAVENOUS | Status: DC | PRN
  Administered 2019-11-15: 150 mg via INTRAVENOUS

## 2019-11-15 MED ORDER — POVIDONE-IODINE 10 % EX SWAB: 2.0000 "application " | Freq: Once | CUTANEOUS | Status: DC

## 2019-11-15 MED ORDER — MIDAZOLAM HCL 2 MG/2ML IJ SOLN
INTRAMUSCULAR | Status: AC
  Filled 2019-11-15: qty 2

## 2019-11-15 MED ORDER — DEXAMETHASONE SODIUM PHOSPHATE 10 MG/ML IJ SOLN
INTRAMUSCULAR | Status: DC | PRN
  Administered 2019-11-15: 5 mg via INTRAVENOUS

## 2019-11-15 MED ORDER — ASPIRIN EC 81 MG PO TBEC
81.0000 mg | DELAYED_RELEASE_TABLET | Freq: Every day | ORAL | 0 refills | Status: DC
Start: 2019-11-15 — End: 2020-11-09

## 2019-11-15 MED ORDER — LIDOCAINE-EPINEPHRINE 1 %-1:100000 IJ SOLN
INTRAMUSCULAR | Status: DC | PRN
  Administered 2019-11-15: 30 mL

## 2019-11-15 MED ORDER — MIDAZOLAM HCL 2 MG/2ML IJ SOLN
1.0000 mg | INTRAMUSCULAR | Status: DC | PRN
  Administered 2019-11-15: 2 mg via INTRAVENOUS

## 2019-11-15 MED ORDER — METHOCARBAMOL 500 MG PO TABS: 500.0000 mg | ORAL_TABLET | Freq: Three times a day (TID) | ORAL | 0 refills | Status: DC | PRN

## 2019-11-15 MED ORDER — MEPERIDINE HCL 25 MG/ML IJ SOLN: 6.2500 mg | INTRAMUSCULAR | Status: DC | PRN

## 2019-11-15 MED ORDER — LACTATED RINGERS IV SOLN: INTRAVENOUS | Status: DC

## 2019-11-15 MED ORDER — CLONIDINE HCL (ANALGESIA) 100 MCG/ML EP SOLN
EPIDURAL | Status: AC
  Filled 2019-11-15: qty 10

## 2019-11-15 MED ORDER — SCOPOLAMINE 1 MG/3DAYS TD PT72
1.0000 | MEDICATED_PATCH | TRANSDERMAL | Status: DC
  Administered 2019-11-15: 1.5 mg via TRANSDERMAL

## 2019-11-15 MED ORDER — CEFAZOLIN SODIUM-DEXTROSE 2-4 GM/100ML-% IV SOLN
2.0000 g | INTRAVENOUS | Status: AC
  Administered 2019-11-15: 2 g via INTRAVENOUS

## 2019-11-15 MED ORDER — SODIUM CHLORIDE 0.9 % IR SOLN
Status: DC | PRN
  Administered 2019-11-15: 2 mL

## 2019-11-15 MED ORDER — BUPIVACAINE-EPINEPHRINE (PF) 0.5% -1:200000 IJ SOLN
INTRAMUSCULAR | Status: DC | PRN
Start: 2019-11-15 — End: 2019-11-15
  Administered 2019-11-15: 30 mL via PERINEURAL

## 2019-11-15 MED ORDER — ONDANSETRON HCL 4 MG/2ML IJ SOLN
INTRAMUSCULAR | Status: DC | PRN
Start: 2019-11-15 — End: 2019-11-15
  Administered 2019-11-15: 4 mg via INTRAVENOUS

## 2019-11-15 MED ORDER — OXYCODONE HCL 5 MG PO TABS: 5.0000 mg | ORAL_TABLET | ORAL | 0 refills | Status: DC | PRN

## 2019-11-15 MED ORDER — MORPHINE SULFATE (PF) 4 MG/ML IV SOLN
INTRAVENOUS | Status: DC | PRN
  Administered 2019-11-15: 8 mg

## 2019-11-15 MED ORDER — LIDOCAINE HCL (CARDIAC) PF 100 MG/5ML IV SOSY
PREFILLED_SYRINGE | INTRAVENOUS | Status: DC | PRN
  Administered 2019-11-15: 100 mg via INTRAVENOUS

## 2019-11-15 MED ORDER — HYDROMORPHONE HCL 1 MG/ML IJ SOLN: 0.2500 mg | INTRAMUSCULAR | Status: DC | PRN

## 2019-11-15 SURGICAL SUPPLY — 56 items
BANDAGE ESMARK 6X9 LF (GAUZE/BANDAGES/DRESSINGS) IMPLANT
BLADE EXCALIBUR 4.0MM X 13CM (MISCELLANEOUS)
BLADE EXCALIBUR 4.0X13 (MISCELLANEOUS) IMPLANT
BLADE SURG 15 STRL LF DISP TIS (BLADE) IMPLANT
BLADE SURG 15 STRL SS (BLADE)
BNDG CMPR 9X6 STRL LF SNTH (GAUZE/BANDAGES/DRESSINGS)
BNDG ELASTIC 4X5.8 VLCR STR LF (GAUZE/BANDAGES/DRESSINGS) ×3 IMPLANT
BNDG ELASTIC 6X5.8 VLCR STR LF (GAUZE/BANDAGES/DRESSINGS) ×3 IMPLANT
BNDG ESMARK 6X9 LF (GAUZE/BANDAGES/DRESSINGS)
COVER WAND RF STERILE (DRAPES) IMPLANT
CUFF TOURN SGL QUICK 34 (TOURNIQUET CUFF)
CUFF TRNQT CYL 34X4.125X (TOURNIQUET CUFF) IMPLANT
DISSECTOR  3.8MM X 13CM (MISCELLANEOUS) ×3
DISSECTOR 3.8MM X 13CM (MISCELLANEOUS) ×1 IMPLANT
DRAPE ARTHROSCOPY W/POUCH 90 (DRAPES) ×3 IMPLANT
DRAPE INCISE IOBAN 66X45 STRL (DRAPES) IMPLANT
DRAPE U-SHAPE 47X51 STRL (DRAPES) ×6 IMPLANT
DRSG TEGADERM 4X4.75 (GAUZE/BANDAGES/DRESSINGS) ×6 IMPLANT
DURAPREP 26ML APPLICATOR (WOUND CARE) ×3 IMPLANT
DW OUTFLOW CASSETTE/TUBE SET (MISCELLANEOUS) ×3 IMPLANT
ELECT REM PT RETURN 9FT ADLT (ELECTROSURGICAL)
ELECTRODE REM PT RTRN 9FT ADLT (ELECTROSURGICAL) IMPLANT
EXCALIBUR 3.8MM X 13CM (MISCELLANEOUS) IMPLANT
GAUZE 4X4 16PLY RFD (DISPOSABLE) IMPLANT
GAUZE SPONGE 4X4 12PLY STRL (GAUZE/BANDAGES/DRESSINGS) ×3 IMPLANT
GAUZE XEROFORM 1X8 LF (GAUZE/BANDAGES/DRESSINGS) ×3 IMPLANT
GLOVE BIO SURGEON STRL SZ7 (GLOVE) ×3 IMPLANT
GLOVE BIOGEL PI IND STRL 7.0 (GLOVE) ×1 IMPLANT
GLOVE BIOGEL PI IND STRL 8 (GLOVE) ×1 IMPLANT
GLOVE BIOGEL PI INDICATOR 7.0 (GLOVE) ×2
GLOVE BIOGEL PI INDICATOR 8 (GLOVE) ×2
GLOVE SURG ORTHO 8.0 STRL STRW (GLOVE) ×3 IMPLANT
GOWN STRL REUS W/ TWL LRG LVL3 (GOWN DISPOSABLE) ×1 IMPLANT
GOWN STRL REUS W/ TWL XL LVL3 (GOWN DISPOSABLE) ×1 IMPLANT
GOWN STRL REUS W/TWL LRG LVL3 (GOWN DISPOSABLE) ×3
GOWN STRL REUS W/TWL XL LVL3 (GOWN DISPOSABLE) ×3
HOLDER KNEE FOAM BLUE (MISCELLANEOUS) IMPLANT
KNEE WRAP E Z 3 GEL PACK (MISCELLANEOUS) ×3 IMPLANT
MANIFOLD NEPTUNE II (INSTRUMENTS) ×3 IMPLANT
NDL HYPO 18GX1.5 BLUNT FILL (NEEDLE) ×1 IMPLANT
NDL SAFETY ECLIPSE 18X1.5 (NEEDLE) ×2 IMPLANT
NEEDLE HYPO 18GX1.5 BLUNT FILL (NEEDLE) ×3 IMPLANT
NEEDLE HYPO 18GX1.5 SHARP (NEEDLE) ×6
PACK DSU ARTHROSCOPY (CUSTOM PROCEDURE TRAY) ×3 IMPLANT
PADDING CAST COTTON 6X4 STRL (CAST SUPPLIES) ×3 IMPLANT
PENCIL SMOKE EVACUATOR (MISCELLANEOUS) IMPLANT
PORT APPOLLO RF 90DEGREE MULTI (SURGICAL WAND) ×2 IMPLANT
SET BASIN DAY SURGERY F.S. (CUSTOM PROCEDURE TRAY) ×3 IMPLANT
SUT ETHILON 3 0 PS 1 (SUTURE) ×3 IMPLANT
SUT VIC AB 2-0 SH 27 (SUTURE)
SUT VIC AB 2-0 SH 27XBRD (SUTURE) IMPLANT
SYR 5ML LL (SYRINGE) ×3 IMPLANT
SYR TB 1ML LL NO SAFETY (SYRINGE) ×3 IMPLANT
TOWEL GREEN STERILE FF (TOWEL DISPOSABLE) ×3 IMPLANT
TRAY DSU PREP LF (CUSTOM PROCEDURE TRAY) ×3 IMPLANT
TUBING ARTHROSCOPY IRRIG 16FT (MISCELLANEOUS) ×3 IMPLANT

## 2019-11-15 NOTE — Brief Op Note (Signed)
   11/15/2019  3:49 PM  PATIENT:  [name]  34 y.o. female  PRE-OPERATIVE DIAGNOSIS:  left knee retropatellar lipoma  POST-OPERATIVE DIAGNOSIS:  left knee retropatellar lipoma  PROCEDURE:  Procedure(s): LEFT ARTHROSCOPY KNEE WITH RETROPATELLAR DEBRIDEMENT  SURGEON:  Surgeon(s): Marlou Sa, Tonna Corner, MD  ASSISTANT: magnant pa  ANESTHESIA:   general  EBL: 5 ml    Total I/O In: 600 [I.V.:600] Out: -   BLOOD ADMINISTERED: none  DRAINS: none   LOCAL MEDICATIONS USED:  none  SPECIMEN:  Lipoma tissue to path  COUNTS:  YES  TOURNIQUET:  * No tourniquets in log *  DICTATION: .Other Dictation: Dictation Number [id number]  PLAN OF CARE: Discharge to home after PACU  PATIENT DISPOSITION:  PACU - hemodynamically stable

## 2019-11-15 NOTE — Anesthesia Procedure Notes (Signed)
Anesthesia Regional Block: Adductor canal block   Pre-Anesthetic Checklist: ,, timeout performed, Correct Patient, Correct Site, Correct Laterality, Correct Procedure, Correct Position, site marked, Risks and benefits discussed,  Surgical consent,  Pre-op evaluation,  At surgeon's request and post-op pain management  Laterality: Left  Prep: chloraprep       Needles:  Injection technique: Single-shot  Needle Type: Echogenic Stimulator Needle      Needle Gauge: 21     Additional Needles:   Narrative:  Start time: 11/15/2019 2:05 PM End time: 11/15/2019 2:15 PM Injection made incrementally with aspirations every 5 mL.  Performed by: Personally  Anesthesiologist: Lillia Abed, MD  Additional Notes: Monitors applied. Patient sedated. Sterile prep and drape,hand hygiene and sterile gloves were used. Relevant anatomy identified.Needle position confirmed.Local anesthetic injected incrementally after negative aspiration. Local anesthetic spread visualized around nerve(s). Vascular puncture avoided. No complications. Image printed for medical record.The patient tolerated the procedure well.    Lillia Abed MD

## 2019-11-15 NOTE — H&P (Signed)
[name] is an 34 y.o. female.   Chief Complaint: Left knee pain HPI:  [name] is a 34 year old patient with left knee pain.'s been going on for 2 years.  Denies any specific injury.  Been much worse over the past 4 months.  She has real difficulty and pain with flexion anywhere near 90 degrees.  Describes the pain is level 8 out of 10.  Used to bike 55 miles a week but she has unable to do that since February.  She has had 2 cortisone shots one this year one last year.  They did not help.  Both cortisone injections were performed from anterior medial approach.  Denies any groin pain or back pain.  Denies any numbness and tingling.  Localizes the pain just on the medial aspect of the patellar tendon.  Does report some stiffness at the end of the day.  She has tried Voltaren gel as well as CBD cream and Tylenol and ibuprofen.  Also tried physical therapy which out any relief.  She has had right knee ACL reconstruction and some type of medial patella stabilizing procedure and lateral release on the right-hand side.  She states that this pain in the knee does not feel like any of the pathology she has experienced before with her ACL tear and patellar instability.  MRI scan accompanies the patient.  It is reviewed frame by frame coronal sagittal and axial views.  Menisci look intact.  Joint surfaces look intact.  ACL and PCL look intact.  There is abnormality of the retropatellar fat pad noted most prominently on the T2 sagittals.  It appears that there may be a focal lipoma or well-circumscribed soft tissue trapezoidal shape tissue in the intercondylar notch.  Fat density is the same as the fat pad on the T1 sagittals.  There is definite well-circumscribed fatty lesion within the retrofat pad seen on the T2 sagittals.  Past Medical History:  Diagnosis Date  . Hypertension   . PONV (postoperative nausea and vomiting)     Past Surgical History:  Procedure Laterality Date  . ABDOMINAL HYSTERECTOMY     . LAPAROSCOPIC GASTRIC SLEEVE RESECTION      Family History  Problem Relation Age of Onset  . Hypertension Mother   . Hypertension Father    Social History:  reports that she has never smoked. She has never used smokeless tobacco. She reports that she does not drink alcohol and does not use drugs.  Allergies:  Allergies  Allergen Reactions  . Aspartame Other (See Comments) and Rash    headaches headaches headaches   . Ibuprofen Other (See Comments)    Other reaction(s): Other "GASTRIC SLEEVE SURGERY RECOMMENDED NOT TAKE" "GASTRIC SLEEVE SURGERY RECOMMENDED NOT TAKE" "GASTRIC SLEEVE SURGERY RECOMMENDED NOT TAKE"     Medications Prior to Admission  Medication Sig Dispense Refill  . cetirizine (ZYRTEC) 10 MG tablet Take 10 mg by mouth daily.    . cholecalciferol (VITAMIN D3) 25 MCG (1000 UNIT) tablet Take 1,000 Units by mouth daily.    . cyanocobalamin (,VITAMIN B-12,) 1000 MCG/ML injection Inject into the muscle.    . lisdexamfetamine (VYVANSE) 30 MG capsule Take 30 mg by mouth daily.    Marland Kitchen lisinopril (ZESTRIL) 5 MG tablet Take 5 mg by mouth daily.    . Multiple Vitamin (MULTIVITAMIN) tablet Take 1 tablet by mouth daily.    . cyclobenzaprine (FLEXERIL) 10 MG tablet Take 1 tablet (10 mg total) by mouth 2 (two) times daily as needed for  muscle spasms. 14 tablet 1    No results found for this or any previous visit (from the past 48 hour(s)). No results found.  Review of Systems All systems reviewed are negative as they relate to the chief complaint within the history of present illness.  Patient denies  fevers or chills.   Height 5\' 4"  (1.626 m), weight 69.9 kg. Physical Exam  HENT:  Head: Normocephalic.  Nose: Nose normal.  Mouth/Throat: Mucous membranes are moist.  Eyes: Pupils are equal, round, and reactive to light.  Cardiovascular: Normal rate and normal pulses.  Respiratory: Effort normal.  Musculoskeletal:     Cervical back: Normal range of motion.   Neurological: She is alert.  Skin: Skin is warm.  Psychiatric: Mood normal.  Examination of the left knee demonstrates tenderness on the medial aspect of the patellar tendon.  Range of motion is mildly painful after 90.  Collateral and cruciate ligaments are stable.  No patellar apprehension.  No effusion.  No nerve root tension signs.  No groin pain with internal or external rotation of the leg.  No paresthesias in the left lower leg.  Assessment/Plan Impression is left knee pain pretty incapacitating in an otherwise active patient.  She has had multiple injections without relief.  MRI scan shows intact ligaments and menisci.  She does have an abnormality of the retropatellar fat pad which I think could be causing some of these problems.  That is not a definite however.  Did discuss with [name] about the risk and benefits of surgical exploration in this case.  The plan would be removal of that retropatellar fat pad region which looks abnormal on the scan.  The risk and benefits are discussed including but not limited to infection nerve vessel damage incomplete pain relief as well as potentially no pain relief.  Nonetheless there is really no other abnormality visible on the MRI scan and this fat pad call is a soft call which is all explained to the patient as well.  Nonetheless at this point with over 6 months of symptoms and failure of conservative management including activity modification going from 55 miles of riding a week to 0 miles of riding a week along with 2 injections neither of which helped I think it is reasonable to proceed with arthroscopic evaluation.  All questions answered.  No personal or family history of DVT or pulmonary embolism.  Anderson Malta, MD 11/15/2019, 11:54 AM

## 2019-11-15 NOTE — Discharge Instructions (Signed)

## 2019-11-15 NOTE — Op Note (Signed)
NAME: [name], [name] MEDICAL RECORD [id number] ACCOUNT [id number] DATE OF BIRTH:02/27/1986 FACILITY: [facility] LOCATION: [facility] PHYSICIAN:[name] Pigg, MD  OPERATIVE REPORT  DATE OF PROCEDURE:  11/15/2019  PREOPERATIVE DIAGNOSIS:  Left knee retropatellar lipoma and pain.  POSTOPERATIVE DIAGNOSIS:  Left knee retropatellar lipoma and pain.  PROCEDURE:  Left knee arthroscopy with debridement of the lateral tibial plateau chondral damage and resection of retropatellar fat pad lipoma and the fat pad itself.  SURGEON:  [name], MD  ASSISTANT:  [name], PA  INDICATIONS:  The patient is a 34 year old patient with left knee pain refractory to nonoperative management.  She has had multiple injections.  Review of MRI scan suggested possible lipoma on top of the normal fat pad.  She presents now for operative  management after explanation of risks and benefits.  The patient essentially has exhausted all conservative measures.  OPERATIVE FINDINGS: 1.  Examination under anesthesia:  The patient had about 10 degrees of hyperextension, full flexion, stable collateral and cruciate ligaments to varus and valgus stress at 0 and 30 degrees.  ACL, PCL intact.  No posterolateral rotatory instability. 2.  Diagnostic arthroscopy: 1.  Intact patellofemoral joint. 2.  No loose bodies in the medial or lateral gutter. 3.  Prominent lipoma-type tissue within the intercondylar notch region with connections medial plica to the medial and lateral capsular regions of the knee, which was essentially suspending a large portion of the fat pad superiorly underneath the  patella. 4.  Intact ACL and PCL. 5.  Intact articular cartilage and meniscus on the medial side, intact meniscus on the lateral side with grade II changes over 50% of the weightbearing surface area of the lateral femoral condyle.  PROCEDURE IN DETAIL:  The patient was brought to the operating room where general anesthetic was  induced.  Preoperative antibiotics administered.  Timeout was called.  Left leg was prescrubbed with alcohol and Betadine, allowed to air dry, prepped with  DuraPrep solution and draped in a sterile manner.  Ioban used to cover the operative field.  A solution of lidocaine with epinephrine was used to anesthetize the anterior inferolateral and anterior inferomedial portals.  Anterior inferolateral portal was  established.  Anterior inferomedial portal was established under direct visualization.  The patient did have very prominent fat pad, which was suspended by the medial plica and lateral plica.  This area was resected.  The fat itself did not appear  atypical.  Some of it was sent to pathology.  There were some calcified fragments within which were also sent to pathology.  These were likely from prior injections.  The retropatellar fat pad was excised.  Bleeding points encountered were controlled  using Arthrocare wand.  Medial compartment was inspected.  The meniscus was intact.  There were no chondral injuries on the medial femoral condyle or medial femoral plateau.  ACL, PCL was intact.  The lateral compartment was inspected and the femur was  intact, but there was some chondromalacia on the lateral tibial plateau, which was debrided using a shaver.  The patellofemoral joint was also intact.  Following debridement, thorough irrigation of the knee joint was performed.  Instruments were removed  and portals were closed using 3-0 nylon.  Solution of Marcaine, morphine, clonidine then injected into the knee.  Bulky dressing applied.  The patient tolerated the procedure well without immediate complications, transferred to the recovery room in  stable condition.  Luke's assistance was required for limb positioning as well as opening and closing.  His assistance was a medical necessity.  VN/NUANCE  D:11/15/2019 T:11/15/2019 JOB:[id number]

## 2019-11-15 NOTE — Anesthesia Preprocedure Evaluation (Signed)
Anesthesia Evaluation  Patient identified by MRN, date of birth, ID band Patient awake    Reviewed: Allergy & Precautions, NPO status , Patient's Chart, lab work & pertinent test results  History of Anesthesia Complications (+) PONV  Airway Mallampati: I  TM Distance: >3 FB Neck ROM: Full    Dental   Pulmonary    Pulmonary exam normal        Cardiovascular hypertension, Pt. on medications Normal cardiovascular exam     Neuro/Psych    GI/Hepatic   Endo/Other    Renal/GU      Musculoskeletal   Abdominal   Peds  Hematology   Anesthesia Other Findings   Reproductive/Obstetrics                             Anesthesia Physical Anesthesia Plan  ASA: II  Anesthesia Plan: General   Post-op Pain Management:  Regional for Post-op pain   Induction:   PONV Risk Score and Plan: 4 or greater and Scopolamine patch - Pre-op, Ondansetron and Midazolam  Airway Management Planned: LMA  Additional Equipment:   Intra-op Plan:   Post-operative Plan: Extubation in OR  Informed Consent: I have reviewed the patients History and Physical, chart, labs and discussed the procedure including the risks, benefits and alternatives for the proposed anesthesia with the patient or authorized representative who has indicated his/her understanding and acceptance.       Plan Discussed with: CRNA and Surgeon  Anesthesia Plan Comments:         Anesthesia Quick Evaluation

## 2019-11-15 NOTE — Anesthesia Postprocedure Evaluation (Signed)
Anesthesia Post Note  Patient: [name]  Procedure(s) Performed: LEFT ARTHROSCOPY KNEE WITH RETROPATELLAR DEBRIDEMENT (Left Knee)     Patient location during evaluation: PACU Anesthesia Type: General Level of consciousness: awake and alert Pain management: pain level controlled Vital Signs Assessment: post-procedure vital signs reviewed and stable Respiratory status: spontaneous breathing, nonlabored ventilation, respiratory function stable and patient connected to nasal cannula oxygen Cardiovascular status: blood pressure returned to baseline and stable Postop Assessment: no apparent nausea or vomiting Anesthetic complications: no   No complications documented.  Last Vitals:  Vitals:   11/15/19 1633 11/15/19 1650  BP:  135/89  Pulse: 89   Resp: 14 16  Temp:  36.8 C  SpO2: 98% 99%    Last Pain:  Vitals:   11/15/19 1650  TempSrc:   PainSc: 1                  Ski Polich DAVID

## 2019-11-15 NOTE — Transfer of Care (Signed)
Immediate Anesthesia Transfer of Care Note  Patient: MARLENE BEIDLER  Procedure(s) Performed: LEFT ARTHROSCOPY KNEE WITH RETROPATELLAR DEBRIDEMENT (Left Knee)  Patient Location: PACU  Anesthesia Type:GA combined with regional for post-op pain  Level of Consciousness: awake, alert , oriented, drowsy and patient cooperative  Airway & Oxygen Therapy: Patient Spontanous Breathing and Patient connected to nasal cannula oxygen  Post-op Assessment: Report given to RN and Post -op Vital signs reviewed and stable  Post vital signs: Reviewed and stable  Last Vitals:  Vitals Value Taken Time  BP 135/87 11/15/19 1551  Temp    Pulse 94 11/15/19 1556  Resp 12 11/15/19 1556  SpO2 100 % 11/15/19 1556  Vitals shown include unvalidated device data.  Last Pain:  Vitals:   11/15/19 1219  TempSrc: Oral  PainSc: 6       Patients Stated Pain Goal: 4 (85/92/92 4462)  Complications: No complications documented.

## 2019-11-15 NOTE — Anesthesia Procedure Notes (Signed)
Procedure Name: LMA Insertion Date/Time: 11/15/2019 2:52 PM Performed by: Raenette Rover, CRNA Pre-anesthesia Checklist: Patient identified, Emergency Drugs available, Suction available and Patient being monitored Patient Re-evaluated:Patient Re-evaluated prior to induction Oxygen Delivery Method: Circle system utilized Preoxygenation: Pre-oxygenation with 100% oxygen Induction Type: IV induction LMA: LMA inserted LMA Size: 4.0 Number of attempts: 1 Placement Confirmation: positive ETCO2 and breath sounds checked- equal and bilateral Tube secured with: Tape Dental Injury: Teeth and Oropharynx as per pre-operative assessment

## 2019-11-15 NOTE — Progress Notes (Signed)
AssistedDr. Ossey with left, ultrasound guided, adductor canal block. Side rails up, monitors on throughout procedure. See vital signs in flow sheet. Tolerated Procedure well.  

## 2019-11-16 ENCOUNTER — Encounter (HOSPITAL_BASED_OUTPATIENT_CLINIC_OR_DEPARTMENT_OTHER): Payer: Self-pay | Admitting: Orthopedic Surgery

## 2019-11-16 LAB — SURGICAL PATHOLOGY

## 2019-11-22 ENCOUNTER — Inpatient Hospital Stay: Payer: BC Managed Care – PPO | Admitting: Orthopedic Surgery

## 2019-11-22 ENCOUNTER — Ambulatory Visit (INDEPENDENT_AMBULATORY_CARE_PROVIDER_SITE_OTHER): Payer: BC Managed Care – PPO | Admitting: Orthopedic Surgery

## 2019-11-22 ENCOUNTER — Encounter: Payer: Self-pay | Admitting: Orthopedic Surgery

## 2019-11-22 DIAGNOSIS — M25862 Other specified joint disorders, left knee: Secondary | ICD-10-CM

## 2019-11-22 NOTE — Progress Notes (Signed)
   Post-Op Visit Note   Patient: [name]           Date of Birth: 09-Jul-1985           MRN: [id number] Visit Date: 11/22/2019 PCP: Louis Meckel, PA-C   Assessment & Plan:  Chief Complaint:  Chief Complaint  Patient presents with  . Left Knee - Routine Post Op   Visit Diagnoses:  1. Cyst of left knee joint     Plan: [name] is now about a week out left knee arthroscopy with removal of lipoma retropatellar region.  She is doing well.  On exam she has flexion to 90 and mild effusion.  The medial portal has a little bit of bleeding after the sutures were removed.  That Steri-Stripped with benzoin.  Ace wrap applied.  I would have her hold off on riding the bike until Tuesday.  We will see her back next Thursday just to recheck that incision.  I do want her to not bend past 90 degrees until she starts back on the bike on Tuesday just to give that a few more days to heal over.  All in all her pain relief is pretty good at this time.  No calf tenderness negative Homans.  Follow-Up Instructions: Return in about 1 week (around 11/29/2019).   Orders:  No orders of the defined types were placed in this encounter.  No orders of the defined types were placed in this encounter.   Imaging: No results found.  PMFS History: There are no problems to display for this patient.  Past Medical History:  Diagnosis Date  . Hypertension   . PONV (postoperative nausea and vomiting)     Family History  Problem Relation Age of Onset  . Hypertension Mother   . Hypertension Father     Past Surgical History:  Procedure Laterality Date  . ABDOMINAL HYSTERECTOMY    . KNEE ARTHROSCOPY Left 11/15/2019   Procedure: LEFT ARTHROSCOPY KNEE WITH RETROPATELLAR DEBRIDEMENT;  Surgeon: [name], MD;  Location: [place] [facility];  Service: Orthopedics;  Laterality: Left;  . LAPAROSCOPIC GASTRIC SLEEVE RESECTION     Social History   Occupational History  . Not on file    Tobacco Use  . Smoking status: Never Smoker  . Smokeless tobacco: Never Used  Substance and Sexual Activity  . Alcohol use: Never  . Drug use: Never  . Sexual activity: Not on file

## 2019-11-29 ENCOUNTER — Encounter: Payer: Self-pay | Admitting: Orthopedic Surgery

## 2019-11-29 ENCOUNTER — Ambulatory Visit (INDEPENDENT_AMBULATORY_CARE_PROVIDER_SITE_OTHER): Payer: BC Managed Care – PPO | Admitting: Orthopedic Surgery

## 2019-11-29 DIAGNOSIS — M79662 Pain in left lower leg: Secondary | ICD-10-CM

## 2019-11-29 DIAGNOSIS — M25862 Other specified joint disorders, left knee: Secondary | ICD-10-CM

## 2019-11-30 ENCOUNTER — Ambulatory Visit (HOSPITAL_BASED_OUTPATIENT_CLINIC_OR_DEPARTMENT_OTHER)
Admission: RE | Admit: 2019-11-30 | Discharge: 2019-11-30 | Disposition: A | Discharge status: Home / Self Care | Payer: BC Managed Care – PPO | Source: Ambulatory Visit | Attending: Orthopedic Surgery | Admitting: Orthopedic Surgery

## 2019-11-30 ENCOUNTER — Other Ambulatory Visit: Payer: Self-pay

## 2019-11-30 DIAGNOSIS — M79662 Pain in left lower leg: Secondary | ICD-10-CM | POA: Diagnosis present

## 2019-12-03 ENCOUNTER — Encounter: Payer: Self-pay | Admitting: Orthopedic Surgery

## 2019-12-03 NOTE — Progress Notes (Signed)
° °  Post-Op Visit Note   Patient: [name]           Date of Birth: 04-19-1986           MRN: [id number] Visit Date: 11/29/2019 PCP: Louis Meckel, PA-C   Assessment & Plan:  Chief Complaint:  Chief Complaint  Patient presents with   Left Knee - Routine Post Op   Visit Diagnoses:  1. Cyst of left knee joint     Plan: Patient is a 34 year old female presents s/p left knee arthroscopy with retropatellar lipoma debridement on 11/15/2019.  Patient notes that in the last week she has had more posterior pain as well as stiffness of the knee.  She has not had to take any medications for pain.  She has posterior pain since Friday.  She also has occasional calf pain that has developed in the last 2 days.  Pain is worse with standing up and getting out of bed.  Overall pain is manageable.  She denies any shortness of breath.  On exam her portal incisions are healing well with no expressible drainage.  She has 0 degrees of extension and greater than 120 degrees of flexion.  She has negative Homans' sign and mild tenderness to palpation in the posterior calf.  No cord is palpable in the posterior calf.  She does have a Baker's cyst that is not present in her right knee.  This Baker's cyst is tender to palpation.  She indicates that this is where the pain is.  Recommended that she try anti-inflammatory regimen over-the-counter as well as use a stationary bike to continue working on the stiffness that she is feeling.  Baker's cyst should resolve with time.  Calf pain does not seem to be a DVT but highly recommended that she call the office or report to urgent care/emergency department if her calf pain rapidly worsens.  Patient agrees with plan.  Follow-up in 3 weeks for clinical recheck.  Follow-Up Instructions: No follow-ups on file.   Orders:  No orders of the defined types were placed in this encounter.  No orders of the defined types were placed in this encounter.   Imaging: No  results found.  PMFS History: There are no problems to display for this patient.  Past Medical History:  Diagnosis Date   Hypertension    PONV (postoperative nausea and vomiting)     Family History  Problem Relation Age of Onset   Hypertension Mother    Hypertension Father     Past Surgical History:  Procedure Laterality Date   ABDOMINAL HYSTERECTOMY     KNEE ARTHROSCOPY Left 11/15/2019   Procedure: LEFT ARTHROSCOPY KNEE WITH RETROPATELLAR DEBRIDEMENT;  Surgeon: [name], MD;  Location: [facility];  Service: Orthopedics;  Laterality: Left;   LAPAROSCOPIC GASTRIC SLEEVE RESECTION     Social History   Occupational History   Not on file  Tobacco Use   Smoking status: Never Smoker   Smokeless tobacco: Never Used  Substance and Sexual Activity   Alcohol use: Never   Drug use: Never   Sexual activity: Not on file

## 2019-12-24 ENCOUNTER — Encounter: Payer: Self-pay | Admitting: Orthopedic Surgery

## 2019-12-24 ENCOUNTER — Ambulatory Visit (INDEPENDENT_AMBULATORY_CARE_PROVIDER_SITE_OTHER): Payer: BC Managed Care – PPO | Admitting: Orthopedic Surgery

## 2019-12-24 VITALS — Ht 64.0 in | Wt 150.0 lb

## 2019-12-24 DIAGNOSIS — M25862 Other specified joint disorders, left knee: Secondary | ICD-10-CM

## 2019-12-26 ENCOUNTER — Telehealth: Payer: Self-pay

## 2019-12-26 NOTE — Telephone Encounter (Signed)
Patient called in saying she fell yesterday on left knee that was operated on , wante to speak about coming soon or mri

## 2019-12-26 NOTE — Telephone Encounter (Signed)
Offered appt for today but patient refused. Worked her in for Friday afternoon.

## 2019-12-28 ENCOUNTER — Ambulatory Visit: Payer: Self-pay

## 2019-12-28 ENCOUNTER — Ambulatory Visit (INDEPENDENT_AMBULATORY_CARE_PROVIDER_SITE_OTHER): Payer: BC Managed Care – PPO | Admitting: Surgical

## 2019-12-28 DIAGNOSIS — M25462 Effusion, left knee: Secondary | ICD-10-CM

## 2019-12-28 DIAGNOSIS — M25562 Pain in left knee: Secondary | ICD-10-CM

## 2019-12-29 ENCOUNTER — Encounter: Payer: Self-pay | Admitting: Orthopedic Surgery

## 2019-12-29 ENCOUNTER — Encounter: Payer: Self-pay | Admitting: Surgical

## 2019-12-29 NOTE — Progress Notes (Signed)
° °  Post-Op Visit Note   Patient: [name]           Date of Birth: 1986-02-19           MRN: [id number] Visit Date: 12/24/2019 PCP: Louis Meckel, PA-C   Assessment & Plan:  Chief Complaint:  Chief Complaint  Patient presents with   Left Knee - Follow-up    11/15/2019 Left knee scope with retropatellar deb   Visit Diagnoses:  1. Cyst of left knee joint     Plan: [name] is a patient who is now about 5 weeks out left knee arthroscopy with retropatellar debridement.  She reports some clicking and pain in the knee.  Tylenol has not given her much relief.  She is back on her bike however.  She has some difficulty achieving full extension while she is walking.  On exam she has mild effusion full range of motion including full extension.  I will detect much in the way of atypical popping or clicking in the postoperative left knee.  Plan at this time is continued quad strengthening exercises.  No calf tenderness negative Homans today.  6-week return for clinical recheck.  Follow-Up Instructions: Return in about 6 weeks (around 02/04/2020).   Orders:  No orders of the defined types were placed in this encounter.  No orders of the defined types were placed in this encounter.   Imaging: No results found.  PMFS History: There are no problems to display for this patient.  Past Medical History:  Diagnosis Date   Hypertension    PONV (postoperative nausea and vomiting)     Family History  Problem Relation Age of Onset   Hypertension Mother    Hypertension Father     Past Surgical History:  Procedure Laterality Date   ABDOMINAL HYSTERECTOMY     KNEE ARTHROSCOPY Left 11/15/2019   Procedure: LEFT ARTHROSCOPY KNEE WITH RETROPATELLAR DEBRIDEMENT;  Surgeon: [name], MD;  Location: [place];  Service: Orthopedics;  Laterality: Left;   LAPAROSCOPIC GASTRIC SLEEVE RESECTION     Social History   Occupational History   Not on file   Tobacco Use   Smoking status: Never Smoker   Smokeless tobacco: Never Used  Substance and Sexual Activity   Alcohol use: Never   Drug use: Never   Sexual activity: Not on file

## 2019-12-29 NOTE — Progress Notes (Signed)
   Post-Op Visit Note   Patient: [name]           Date of Birth: 12/29/1985           MRN: [id number] Visit Date: 12/28/2019 PCP: Louis Meckel, PA-C   Assessment & Plan:  Chief Complaint:  Chief Complaint  Patient presents with  . Left Knee - Pain   Visit Diagnoses:  1. Left knee pain, unspecified chronicity   2. Effusion, left knee     Plan: Patient is a 34 year old female presents s/p left knee arthroscopy with retropatellar debridement on 11/15/2019.  Patient notes that she fell on Tuesday onto her left knee.  Since the fall she complains of progressively worsening left knee pain that she localizes to the lateral aspect of the left knee.  She notes catching and clicking of the left knee that is new since the fall.  The symptoms are worse with pivoting and rotation of the left knee.  She notes ascending stairs causes locking symptoms of the left knee.  This happens constantly throughout the day.  She has a history of right knee meniscal repair, MPFL reconstruction, ACL reconstruction.  No surgery on the left knee aside from the arthroscopy recently.  She denies any numbness/tingling, groin pain, radicular pain.  She does have a significant Baker's cyst on exam that has been present for several weeks.  Palpable clicking is felt on exam over the lateral joint line when she fully extends.  She has lateral joint line tenderness and tenderness over the lateral patellofemoral joint as well.  No significant laxity of the MPFL, ACL, MCL, LCL.  No posterior lateral rotational instability.  An effusion is present but difficult to determine if this is new or if this is left over effusion from her recent procedure.  Radiographs taken today are negative for any findings to explain her pain.  With new mechanical symptoms and an effusion is present, plan to order MRI of the left knee for further evaluation.  Patient agreed with plan.  Ultrasound was applied to the knee and nothing was  observed on ultrasound today.  Patient will follow-up after MRI to review results.  Follow-Up Instructions: No follow-ups on file.   Orders:  Orders Placed This Encounter  Procedures  . XR KNEE 3 VIEW LEFT  . MR Knee Left w/o contrast   No orders of the defined types were placed in this encounter.   Imaging: No results found.  PMFS History: There are no problems to display for this patient.  Past Medical History:  Diagnosis Date  . Hypertension   . PONV (postoperative nausea and vomiting)     Family History  Problem Relation Age of Onset  . Hypertension Mother   . Hypertension Father     Past Surgical History:  Procedure Laterality Date  . ABDOMINAL HYSTERECTOMY    . KNEE ARTHROSCOPY Left 11/15/2019   Procedure: LEFT ARTHROSCOPY KNEE WITH RETROPATELLAR DEBRIDEMENT;  Surgeon: [name], MD;  Location: [facility];  Service: Orthopedics;  Laterality: Left;  . LAPAROSCOPIC GASTRIC SLEEVE RESECTION     Social History   Occupational History  . Not on file  Tobacco Use  . Smoking status: Never Smoker  . Smokeless tobacco: Never Used  Substance and Sexual Activity  . Alcohol use: Never  . Drug use: Never  . Sexual activity: Not on file

## 2019-12-31 ENCOUNTER — Telehealth: Payer: Self-pay | Admitting: *Deleted

## 2019-12-31 NOTE — Telephone Encounter (Signed)
Received call from Caren Griffins with [facility] stating they are needing pts' order faxed to [place] and to them at [phone] and [phone]. Orders has been faxed to both locations.

## 2020-01-16 ENCOUNTER — Ambulatory Visit (INDEPENDENT_AMBULATORY_CARE_PROVIDER_SITE_OTHER): Payer: BC Managed Care – PPO | Admitting: Orthopedic Surgery

## 2020-01-16 DIAGNOSIS — M25462 Effusion, left knee: Secondary | ICD-10-CM

## 2020-01-20 NOTE — Progress Notes (Signed)
Office Visit Note   Patient: [name]           Date of Birth: August 29, 1985           MRN: [id number] Visit Date: 01/16/2020 Requested by: Louis Meckel, PA-C [place],  [place] [id number] PCP: Louis Meckel, PA-C  Subjective: Chief Complaint  Patient presents with  . Follow-up    HPI: [name] is a 34 y.o. female who presents to the office complaining of left knee pain. Patient has had continued pain since her last office visit. She had an MRI scan that was done on 01/03/20 due to perceived mechanical symptoms with flexion/extension of the left knee. MRI scan yielded no significant abnormalities that could explain her pain. She notes that the posterior left knee pain that she was experiencing has improved. She does note continued medial and lateral joint line pain as well as lateral clicking when her knee is brought to full extension. She notes no significant improvement since last office visit. Last injection was in February..                ROS: All systems reviewed are negative as they relate to the chief complaint within the history of present illness.  Patient denies fevers or chills.  Assessment & Plan: Visit Diagnoses:  1. Effusion, left knee     Plan: Patient is a 34 year old female who presents complaining of continued left knee pain. She has had increased left knee pain since a fall on 7/27. MRI scan was normal. She notes no improvement since her last office visit. Plan to try left knee injection with cortisone injection today. Patient tolerated the procedure well. She is not able to take NSAIDs due to GI upset. Plan for patient to follow-up in 6 weeks for clinical recheck.  No evidence at this time of post injury structural damage requiring further surgical intervention.  Follow-Up Instructions: No follow-ups on file.   Orders:  No orders of the defined types were placed in this encounter.  No orders of the defined  types were placed in this encounter.     Procedures: No procedures performed   Clinical Data: No additional findings.  Objective: Vital Signs: There were no vitals taken for this visit.  Physical Exam:  Constitutional: Patient appears well-developed HEENT:  Head: Normocephalic Eyes:EOM are normal Neck: Normal range of motion Cardiovascular: Normal rate Pulmonary/chest: Effort normal Neurologic: Patient is alert Skin: Skin is warm Psychiatric: Patient has normal mood and affect  Ortho Exam: Ortho exam demonstrates trace effusion. Extensor mechanism intact. Tenderness palpation over the lateral joint line. No tenderness over the medial joint line. No significant ligamentous instability. There is a very subtle clicking sensation that is felt at the lateral patellofemoral joint when her leg is brought into full extension.  Specialty Comments:  No specialty comments available.  Imaging: No results found.   PMFS History: There are no problems to display for this patient.  Past Medical History:  Diagnosis Date  . Hypertension   . PONV (postoperative nausea and vomiting)     Family History  Problem Relation Age of Onset  . Hypertension Mother   . Hypertension Father     Past Surgical History:  Procedure Laterality Date  . ABDOMINAL HYSTERECTOMY    . KNEE ARTHROSCOPY Left 11/15/2019   Procedure: LEFT ARTHROSCOPY KNEE WITH RETROPATELLAR DEBRIDEMENT;  Surgeon: [name], MD;  Location: [facility];  Service: Orthopedics;  Laterality:  Left;  . LAPAROSCOPIC GASTRIC SLEEVE RESECTION     Social History   Occupational History  . Not on file  Tobacco Use  . Smoking status: Never Smoker  . Smokeless tobacco: Never Used  Substance and Sexual Activity  . Alcohol use: Never  . Drug use: Never  . Sexual activity: Not on file

## 2020-01-24 ENCOUNTER — Encounter: Payer: Self-pay | Admitting: Orthopedic Surgery

## 2020-02-11 ENCOUNTER — Ambulatory Visit: Payer: BC Managed Care – PPO | Admitting: Orthopedic Surgery

## 2020-11-08 ENCOUNTER — Inpatient Hospital Stay (HOSPITAL_COMMUNITY)
Admission: EM | Admit: 2020-11-08 | Discharge: 2020-11-09 | DRG: 964 | Disposition: A | Discharge status: Home / Self Care | Payer: BC Managed Care – PPO | Attending: General Surgery | Admitting: General Surgery

## 2020-11-08 ENCOUNTER — Emergency Department (HOSPITAL_COMMUNITY): Payer: BC Managed Care – PPO

## 2020-11-08 ENCOUNTER — Inpatient Hospital Stay (HOSPITAL_COMMUNITY): Payer: BC Managed Care – PPO

## 2020-11-08 ENCOUNTER — Other Ambulatory Visit: Payer: Self-pay

## 2020-11-08 DIAGNOSIS — S270XXA Traumatic pneumothorax, initial encounter: Secondary | ICD-10-CM | POA: Diagnosis present

## 2020-11-08 DIAGNOSIS — Z20822 Contact with and (suspected) exposure to covid-19: Secondary | ICD-10-CM | POA: Diagnosis present

## 2020-11-08 DIAGNOSIS — S32119A Unspecified Zone I fracture of sacrum, initial encounter for closed fracture: Secondary | ICD-10-CM | POA: Diagnosis present

## 2020-11-08 DIAGNOSIS — J939 Pneumothorax, unspecified: Secondary | ICD-10-CM

## 2020-11-08 DIAGNOSIS — Z7982 Long term (current) use of aspirin: Secondary | ICD-10-CM

## 2020-11-08 DIAGNOSIS — Y92410 Unspecified street and highway as the place of occurrence of the external cause: Secondary | ICD-10-CM

## 2020-11-08 DIAGNOSIS — S12500A Unspecified displaced fracture of sixth cervical vertebra, initial encounter for closed fracture: Principal | ICD-10-CM | POA: Diagnosis present

## 2020-11-08 DIAGNOSIS — Z8249 Family history of ischemic heart disease and other diseases of the circulatory system: Secondary | ICD-10-CM | POA: Diagnosis not present

## 2020-11-08 DIAGNOSIS — S2243XA Multiple fractures of ribs, bilateral, initial encounter for closed fracture: Secondary | ICD-10-CM | POA: Diagnosis present

## 2020-11-08 DIAGNOSIS — Z888 Allergy status to other drugs, medicaments and biological substances status: Secondary | ICD-10-CM

## 2020-11-08 DIAGNOSIS — I10 Essential (primary) hypertension: Secondary | ICD-10-CM | POA: Diagnosis present

## 2020-11-08 LAB — COMPREHENSIVE METABOLIC PANEL
ALT: 268 U/L — ABNORMAL HIGH (ref 0–44)
AST: 408 U/L — ABNORMAL HIGH (ref 15–41)
Albumin: 4 g/dL (ref 3.5–5.0)
Alkaline Phosphatase: 45 U/L (ref 38–126)
Anion gap: 13 (ref 5–15)
BUN: 9 mg/dL (ref 6–20)
CO2: 22 mmol/L (ref 22–32)
Calcium: 8.8 mg/dL — ABNORMAL LOW (ref 8.9–10.3)
Chloride: 103 mmol/L (ref 98–111)
Creatinine, Ser: 0.79 mg/dL (ref 0.44–1.00)
GFR, Estimated: >60 mL/min (ref >60)
Glucose, Bld: 110 mg/dL — ABNORMAL HIGH (ref 70–99)
Potassium: 3.5 mmol/L (ref 3.5–5.1)
Sodium: 138 mmol/L (ref 135–145)
Total Bilirubin: 0.5 mg/dL (ref 0.3–1.2)
Total Protein: 6.5 g/dL (ref 6.5–8.1)

## 2020-11-08 LAB — RESP PANEL BY RT-PCR (FLU A&B, COVID) ARPGX2
Influenza A by PCR: NEGATIVE
Influenza B by PCR: NEGATIVE
SARS Coronavirus 2 by RT PCR: NEGATIVE

## 2020-11-08 LAB — CBC WITH DIFFERENTIAL/PLATELET
Abs Immature Granulocytes: 0.15 10*3/uL — ABNORMAL HIGH (ref 0.00–0.07)
Basophils Absolute: 0.1 10*3/uL (ref 0.0–0.1)
Basophils Relative: 0 %
Eosinophils Absolute: 0.1 10*3/uL (ref 0.0–0.5)
Eosinophils Relative: 0 %
HCT: 42.6 % (ref 36.0–46.0)
Hemoglobin: 14 g/dL (ref 12.0–15.0)
Immature Granulocytes: 1 %
Lymphocytes Relative: 12 %
Lymphs Abs: 2 10*3/uL (ref 0.7–4.0)
MCH: 27.3 pg (ref 26.0–34.0)
MCHC: 32.9 g/dL (ref 30.0–36.0)
MCV: 83 fL (ref 80.0–100.0)
Monocytes Absolute: 1 10*3/uL (ref 0.1–1.0)
Monocytes Relative: 6 %
Neutro Abs: 12.9 10*3/uL — ABNORMAL HIGH (ref 1.7–7.7)
Neutrophils Relative %: 81 %
Platelets: 325 10*3/uL (ref 150–400)
RBC: 5.13 MIL/uL — ABNORMAL HIGH (ref 3.87–5.11)
RDW: 13.2 % (ref 11.5–15.5)
WBC: 16.2 10*3/uL — ABNORMAL HIGH (ref 4.0–10.5)
nRBC: 0 % (ref 0.0–0.2)

## 2020-11-08 LAB — URINALYSIS, ROUTINE W REFLEX MICROSCOPIC
Bilirubin Urine: NEGATIVE
Glucose, UA: NEGATIVE mg/dL
Ketones, ur: 20 mg/dL — AB
Leukocytes,Ua: NEGATIVE
Nitrite: NEGATIVE
Protein, ur: NEGATIVE mg/dL
Specific Gravity, Urine: >1.046 — ABNORMAL HIGH (ref 1.005–1.030)
pH: 7 (ref 5.0–8.0)

## 2020-11-08 MED ORDER — VENLAFAXINE HCL ER 37.5 MG PO CP24
37.5000 mg | ORAL_CAPSULE | Freq: Every day | ORAL | Status: DC
  Administered 2020-11-08 – 2020-11-09 (×2): 37.5 mg via ORAL
  Filled 2020-11-08 (×2): qty 1

## 2020-11-08 MED ORDER — DOCUSATE SODIUM 100 MG PO CAPS
100.0000 mg | ORAL_CAPSULE | Freq: Two times a day (BID) | ORAL | Status: DC
  Administered 2020-11-08 – 2020-11-09 (×3): 100 mg via ORAL
  Filled 2020-11-08 (×3): qty 1

## 2020-11-08 MED ORDER — METHOCARBAMOL 500 MG PO TABS
500.0000 mg | ORAL_TABLET | Freq: Three times a day (TID) | ORAL | Status: DC | PRN
  Administered 2020-11-08 – 2020-11-09 (×4): 500 mg via ORAL
  Filled 2020-11-08 (×4): qty 1

## 2020-11-08 MED ORDER — HYDROMORPHONE HCL 1 MG/ML IJ SOLN
0.5000 mg | Freq: Once | INTRAMUSCULAR | Status: AC
  Administered 2020-11-08: 0.5 mg via INTRAVENOUS
  Filled 2020-11-08: qty 1

## 2020-11-08 MED ORDER — BISACODYL 10 MG RE SUPP: 10.0000 mg | Freq: Every day | RECTAL | Status: DC | PRN

## 2020-11-08 MED ORDER — LISINOPRIL 5 MG PO TABS
5.0000 mg | ORAL_TABLET | Freq: Every day | ORAL | Status: DC
  Administered 2020-11-08 – 2020-11-09 (×2): 5 mg via ORAL
  Filled 2020-11-08 (×2): qty 1

## 2020-11-08 MED ORDER — SODIUM CHLORIDE 0.9 % IV SOLN: INTRAVENOUS | Status: DC

## 2020-11-08 MED ORDER — FLUTICASONE PROPIONATE 50 MCG/ACT NA SUSP: 1.0000 | Freq: Every day | NASAL | Status: DC | PRN

## 2020-11-08 MED ORDER — MORPHINE SULFATE (PF) 2 MG/ML IV SOLN
2.0000 mg | INTRAVENOUS | Status: DC | PRN
  Administered 2020-11-08 – 2020-11-09 (×2): 2 mg via INTRAVENOUS
  Filled 2020-11-08 (×3): qty 1

## 2020-11-08 MED ORDER — ENOXAPARIN SODIUM 30 MG/0.3ML IJ SOSY
30.0000 mg | PREFILLED_SYRINGE | Freq: Two times a day (BID) | INTRAMUSCULAR | Status: DC
  Administered 2020-11-09: 30 mg via SUBCUTANEOUS
  Filled 2020-11-08: qty 0.3

## 2020-11-08 MED ORDER — HYDROMORPHONE HCL 1 MG/ML IJ SOLN
1.0000 mg | Freq: Once | INTRAMUSCULAR | Status: AC
  Administered 2020-11-08: 1 mg via INTRAVENOUS
  Filled 2020-11-08: qty 1

## 2020-11-08 MED ORDER — LORATADINE 10 MG PO TABS
10.0000 mg | ORAL_TABLET | Freq: Every day | ORAL | Status: DC
  Administered 2020-11-08 – 2020-11-09 (×2): 10 mg via ORAL
  Filled 2020-11-08 (×3): qty 1

## 2020-11-08 MED ORDER — FENTANYL CITRATE (PF) 100 MCG/2ML IJ SOLN
50.0000 ug | Freq: Once | INTRAMUSCULAR | Status: AC
Start: 2020-11-08 — End: 2020-11-08
  Administered 2020-11-08: 50 ug via INTRAVENOUS
  Filled 2020-11-08: qty 2

## 2020-11-08 MED ORDER — PROCHLORPERAZINE EDISYLATE 10 MG/2ML IJ SOLN
5.0000 mg | Freq: Four times a day (QID) | INTRAMUSCULAR | Status: DC | PRN
  Administered 2020-11-08 (×2): 10 mg via INTRAVENOUS
  Filled 2020-11-08 (×2): qty 2

## 2020-11-08 MED ORDER — OXYCODONE HCL 5 MG PO TABS: 5.0000 mg | ORAL_TABLET | ORAL | Status: DC | PRN

## 2020-11-08 MED ORDER — PROCHLORPERAZINE MALEATE 10 MG PO TABS
10.0000 mg | ORAL_TABLET | Freq: Four times a day (QID) | ORAL | Status: DC | PRN
  Filled 2020-11-08: qty 1

## 2020-11-08 MED ORDER — ALBUTEROL SULFATE (2.5 MG/3ML) 0.083% IN NEBU: 2.5000 mg | INHALATION_SOLUTION | RESPIRATORY_TRACT | Status: DC | PRN

## 2020-11-08 MED ORDER — ACETAMINOPHEN 500 MG PO TABS
1000.0000 mg | ORAL_TABLET | Freq: Four times a day (QID) | ORAL | Status: DC
  Administered 2020-11-08 – 2020-11-09 (×6): 1000 mg via ORAL
  Filled 2020-11-08 (×7): qty 2

## 2020-11-08 MED ORDER — IOHEXOL 300 MG/ML  SOLN
100.0000 mL | Freq: Once | INTRAMUSCULAR | Status: AC | PRN
  Administered 2020-11-08: 100 mL via INTRAVENOUS

## 2020-11-08 MED ORDER — OXYCODONE HCL 5 MG PO TABS
10.0000 mg | ORAL_TABLET | ORAL | Status: DC | PRN
  Administered 2020-11-08 – 2020-11-09 (×5): 10 mg via ORAL
  Filled 2020-11-08 (×5): qty 2

## 2020-11-08 MED ORDER — ONDANSETRON HCL 4 MG/2ML IJ SOLN
4.0000 mg | Freq: Once | INTRAMUSCULAR | Status: AC
  Administered 2020-11-08: 4 mg via INTRAVENOUS
  Filled 2020-11-08: qty 2
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
## 2020-11-08 NOTE — ED Notes (Signed)
Attempted to call 5N no answer.   Attempted to call 5N Charge no answer.

## 2020-11-08 NOTE — ED Notes (Signed)
I tried to call report.  No one answered for 5N floor
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
## 2020-11-08 NOTE — ED Notes (Addendum)
The patient was given a Cup of [employer], family was given Coffee and Ginger Ale.

## 2020-11-08 NOTE — ED Notes (Signed)
Returned from xray

## 2020-11-08 NOTE — ED Notes (Signed)
Pt noted to have RA sat 93%, pt placed on 2L [place], pt c/o difficulty taking deep breath.
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
## 2020-11-08 NOTE — ED Notes (Signed)
Received message from physical therapist that pt can be weight bearing as tolerated

## 2020-11-08 NOTE — ED Notes (Signed)
Denies any numbness or tingling, moving all extremities, a/o X 4, neck brace on
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
## 2020-11-08 NOTE — ED Notes (Signed)
Physical therapy in with pt
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
## 2020-11-08 NOTE — ED Notes (Signed)
Attempted to call report. No answer at

## 2020-11-08 NOTE — ED Notes (Signed)
Assisted RN's to place pt on hospital bed at this time at her request for comfort.
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
## 2020-11-09 MED ORDER — DOCUSATE SODIUM 100 MG PO CAPS: 100.0000 mg | ORAL_CAPSULE | Freq: Two times a day (BID) | ORAL | 0 refills | Status: DC

## 2020-11-09 MED ORDER — OXYCODONE HCL 5 MG PO TABS: 5.0000 mg | ORAL_TABLET | Freq: Four times a day (QID) | ORAL | 0 refills | Status: AC | PRN

## 2020-11-09 MED ORDER — METHOCARBAMOL 500 MG PO TABS: 1000.0000 mg | ORAL_TABLET | Freq: Three times a day (TID) | ORAL | 0 refills | Status: DC | PRN
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
## 2020-11-09 NOTE — Discharge Instructions (Signed)
PNEUMOTHORAX OR HEMOTHORAX +/- RIB FRACTURES  HOME INSTRUCTIONS   PAIN CONTROL:  Pain is best controlled by a usual combination of three different methods TOGETHER:  Ice/Heat Over the counter pain medication Prescription pain medication You may experience some swelling and bruising in area of broken ribs. Ice packs or heating pads (30-60 minutes up to 6 times a day) will help. Use ice for the first few days to help decrease swelling and bruising, then switch to heat to help relax tight/sore spots and speed recovery. Some people prefer to use ice alone, heat alone, alternating between ice & heat. Experiment to what works for you. Swelling and bruising can take several weeks to resolve.  It is helpful to take an over-the-counter pain medication regularly for the first few weeks. Choose one of the following that works best for you:  Naproxen (Aleve, etc) Two 220mg  tabs twice a day Ibuprofen (Advil, etc) Three 200mg  tabs four times a day (every meal & bedtime) Acetaminophen (Tylenol, etc) 500-650mg  four times a day (every meal & bedtime) A prescription for pain medication (such as oxycodone, hydrocodone, etc) may be given to you upon discharge. Take your pain medication as prescribed.  If you are having problems/concerns with the prescription medicine (does not control pain, nausea, vomiting, rash, itching, etc), please call us [phone] to see if we need to switch you to a different pain medicine that will work better for you and/or control your side effect better. If you need a refill on your pain medication, please contact your pharmacy. They will contact our office to request authorization. Prescriptions will not be filled after 5 pm or on week-ends. Avoid getting constipated. When taking pain medications, it is common to experience some constipation. Increasing fluid intake and taking a fiber supplement (such as Metamucil, Citrucel, FiberCon, MiraLax, etc) 1-2 times a day regularly will  usually help prevent this problem from occurring. A mild laxative (prune juice, Milk of Magnesia, MiraLax, etc) should be taken according to package directions if there are no bowel movements after 48 hours.  Watch out for diarrhea. If you have many loose bowel movements, simplify your diet to bland foods & liquids for a few days. Stop any stool softeners and decrease your fiber supplement. Switching to mild anti-diarrheal medications (Kayopectate, Pepto Bismol) can help. If this worsens or does not improve, please call us. Chest tube site wound: you may remove the dressing from your chest tube site 3 days after the removal of your chest tube. DO NOT shower over the dressing. Once   removed, you may shower as normal. Do not submerge your wound in water for 2-3 weeks.  FOLLOW UP  Please call our office to set up or confirm an appointment for follow up for 2 weeks after discharge. You will need to get a chest xray at either [facility] or [facility]. This will be outlined in your follow up instructions. Please call [facility] at [phone] if you have any questions about follow up.  If you have any orthopedic or other injuries you will need to follow up as outlined in your follow up instructions.   WHEN TO CALL us [phone]:  Poor pain control Reactions / problems with new medications (rash/itching, nausea, etc)  Fever over 101.5 F (38.5 C) Worsening swelling or bruising Redness, drainage, pain or swelling around chest tube site Worsening pain, productive cough, difficulty breathing or any other concerning symptoms  The clinic staff is available to answer your questions during  regular business hours (8:30am-5pm). Please don't hesitate to call and ask to speak to one of our nurses for clinical concerns.  If you have a medical emergency, go to the nearest emergency room or call 911.  A surgeon from [facility] is always on call at the [facility], [place], [place], [place],  [address] ?  MAIN: [phone] ? TOLL FREE: [phone] ?  FAX [phone]  [email or web address]      Information on Rib Fractures  A rib fracture is a break or crack in one of the bones of the ribs. The ribs are long, curved bones that wrap around your chest and attach to your spine and your breastbone. The ribs protect your heart, lungs, and other organs in the chest. A broken or cracked rib is often painful but is not usually serious. Most rib fractures heal on their own over time. However, rib fractures can be more serious if multiple ribs are broken or if broken ribs move out of place and push against other structures or organs. What are the causes? This condition is caused by: Repetitive movements with high force, such as pitching a baseball or having severe coughing spells. A direct blow to the chest, such as a sports injury, a car accident, or a fall. Cancer that has spread to the bones, which can weaken bones and cause them to break. What are the signs or symptoms? Symptoms of this condition include: Pain when you breathe in or cough. Pain when someone presses on the injured area. Feeling short of breath. How is this diagnosed? This condition is diagnosed with a physical exam and medical history. Imaging tests may also be done, such as: Chest X-ray. CT scan. MRI. Bone scan. Chest ultrasound. How is this treated? Treatment for this condition depends on the severity of the fracture. Most rib fractures usually heal on their own in 1-3 months. Sometimes healing takes longer if there is a cough that does not stop or if there are other activities that make the injury worse (aggravating factors). While you heal, you will be given medicines to control the pain. You will also be taught deep breathing exercises. Severe injuries may require hospitalization or surgery. Follow these instructions at home: Managing pain,  stiffness, and swelling If directed, apply ice to the injured area. Put ice in a plastic bag. Place a towel between your skin and the bag. Leave the ice on for 20 minutes, 2-3 times a day. Take over-the-counter and prescription medicines only as told by your health care provider. Activity Avoid a lot of activity and any activities or movements that cause pain. Be careful during activities and avoid bumping the injured rib. Slowly increase your activity as told by your health care provider. General instructions Do deep breathing exercises as told by your health care provider. This helps prevent pneumonia, which is a common complication of a broken rib. Your health care provider may instruct you to: Take deep breaths several times a day. Try to cough several times a day, holding a pillow against the injured area. Use a device called incentive spirometer to practice deep breathing several times a day. Drink enough fluid to keep your urine pale yellow. Do not wear a rib belt or binder. These restrict breathing, which can lead to pneumonia. Keep all follow-up visits as told by your health care provider. This is important. Contact a health care provider if: You have a fever. Get  help right away if: You have difficulty breathing or you are short of breath. You develop a cough that does not stop, or you cough up thick or bloody sputum. You have nausea, vomiting, or pain in your abdomen. Your pain gets worse and medicine does not help. Summary A rib fracture is a break or crack in one of the bones of the ribs. A broken or cracked rib is often painful but is not usually serious. Most rib fractures heal on their own over time. Treatment for this condition depends on the severity of the fracture. Avoid a lot of activity and any activities or movements that cause pain. This information is not intended to replace advice given to you by your health care provider. Make sure you discuss any questions  you have with your health care provider. Document Released: 05/17/2005 Document Revised: 08/16/2016 Document Reviewed: 08/16/2016 Elsevier Interactive Patient Education  2019 [employer]    Pneumothorax A pneumothorax is commonly called a collapsed lung. It is a condition in which air leaks from a lung and builds up between the thin layer of tissue that covers the lungs (visceral pleura) and the interior wall of the chest cavity (parietal pleura). The air gets trapped outside the lung, between the lung and the chest wall (pleural space). The air takes up space and prevents the lung from fully expanding. This condition sometimes occurs suddenly with no apparent cause. The buildup of air may be small or large. A small pneumothorax may go away on its own. A large pneumothorax will require treatment and hospitalization. What are the causes? This condition may be caused by: Trauma and injury to the chest wall. Surgery and other medical procedures. A complication of an underlying lung problem, especially chronic obstructive pulmonary disease (COPD) or emphysema. Sometimes the cause of this condition is not known. What increases the risk? You are more likely to develop this condition if: You have an underlying lung problem. You smoke. You are 85-36 years old, female, tall, and underweight. You have a personal or family history of pneumothorax. You have an eating disorder (anorexia nervosa). This condition can also happen quickly, even in people with no history of lung problems. What are the signs or symptoms? Sometimes a pneumothorax will have no symptoms. When symptoms are present, they can include: Chest pain. Shortness of breath. Increased rate of breathing. Bluish color to your lips or skin (cyanosis). How is this diagnosed? This condition may be diagnosed by: A medical history and physical exam. A chest X-ray, chest CT scan, or ultrasound. How is this treated? Treatment depends on  how severe your condition is. The goal of treatment is to remove the extra air and allow your lung to expand back to its normal size. For a small pneumothorax: No treatment may be needed. Extra oxygen is sometimes used to make it go away more quickly. For a large pneumothorax or a pneumothorax that is causing symptoms, a procedure is done to drain the air from your lungs. To do this, a health care provider may use: A needle with a syringe. This is used to suck air from a pleural space where no additional leakage is taking place. A chest tube. This is used to suck air where there is ongoing leakage into the pleural space. The chest tube may need to remain in place for several days until the air leak has healed. In more severe cases, surgery may be needed to repair the damage that is causing the leak. If you  have multiple pneumothorax episodes or have an air leak that will not heal, a procedure called a pleurodesis may be done. A medicine is placed in the pleural space to irritate the tissues around the lung so that the lung will stick to the chest wall, seal any leaks, and stop any buildup of air in that space. If you have an underlying lung problem, severe symptoms, or a large pneumothorax you will usually need to stay in the hospital. Follow these instructions at home: Lifestyle Do not use any products that contain nicotine or tobacco, such as cigarettes and e-cigarettes. These are major risk factors in pneumothorax. If you need help quitting, ask your health care provider. Do not lift anything that is heavier than 10 lb (4.5 kg), or the limit that your health care provider tells you, until he or she says that it is safe. Avoid activities that take a lot of effort (strenuous) for as long as told by your health care provider. Return to your normal activities as told by your health care provider. Ask your health care provider what activities are safe for you. Do not fly in an airplane or scuba dive  until your health care provider says it is okay. General instructions Take over-the-counter and prescription medicines only as told by your health care provider. If a cough or pain makes it difficult for you to sleep at night, try sleeping in a semi-upright position in a recliner or by using 2 or 3 pillows. If you had a chest tube and it was removed, ask your health care provider when you can remove the bandage (dressing). While the dressing is in place, do not allow it to get wet. Keep all follow-up visits as told by your health care provider. This is important. Contact a health care provider if: You cough up thick mucus (sputum) that is yellow or green in color. You were treated with a chest tube, and you have redness, increasing pain, or discharge at the site where it was placed. Get help right away if: You have increasing chest pain or shortness of breath. You have a cough that will not go away. You begin coughing up blood. You have pain that is getting worse or is not controlled with medicines. The site where your chest tube was located opens up. You feel air coming out of the site where the chest tube was placed. You have a fever or persistent symptoms for more than 2-3 days. You have a fever and your symptoms suddenly get worse. These symptoms may represent a serious problem that is an emergency. Do not wait to see if the symptoms will go away. Get medical help right away. Call your local emergency services (911 in the [place] Do not drive yourself to the hospital. Summary A pneumothorax, commonly called a collapsed lung, is a condition in which air leaks from a lung and gets trapped between the lung and the chest wall (pleural space). The buildup of air may be small or large. A small pneumothorax may go away on its own. A large pneumothorax will require treatment and hospitalization. Treatment for this condition depends on how severe the pneumothorax is. The goal of treatment is to  remove the extra air and allow the lung to expand back to its normal size. This information is not intended to replace advice given to you by your health care provider. Make sure you discuss any questions you have with your health care provider. Document Released: 05/17/2005 Document Revised: 04/25/2017 Document  Reviewed: 04/25/2017 Elsevier Interactive Patient Education  [employer].
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
## 2020-11-25 ENCOUNTER — Other Ambulatory Visit: Payer: Self-pay

## 2020-11-25 ENCOUNTER — Other Ambulatory Visit: Payer: Self-pay | Admitting: Physician Assistant

## 2020-11-25 ENCOUNTER — Ambulatory Visit
Admission: RE | Admit: 2020-11-25 | Discharge: 2020-11-25 | Disposition: A | Discharge status: Home / Self Care | Payer: BC Managed Care – PPO | Source: Ambulatory Visit | Attending: Physician Assistant | Admitting: Physician Assistant

## 2020-11-25 DIAGNOSIS — J939 Pneumothorax, unspecified: Secondary | ICD-10-CM

## 2020-12-08 ENCOUNTER — Telehealth: Payer: Self-pay

## 2020-12-08 ENCOUNTER — Other Ambulatory Visit: Payer: Self-pay

## 2020-12-08 ENCOUNTER — Ambulatory Visit: Payer: Self-pay

## 2020-12-08 ENCOUNTER — Ambulatory Visit (INDEPENDENT_AMBULATORY_CARE_PROVIDER_SITE_OTHER): Payer: BC Managed Care – PPO | Admitting: Surgical

## 2020-12-08 DIAGNOSIS — M25532 Pain in left wrist: Secondary | ICD-10-CM | POA: Diagnosis not present

## 2020-12-08 DIAGNOSIS — M25562 Pain in left knee: Secondary | ICD-10-CM | POA: Diagnosis not present

## 2020-12-08 DIAGNOSIS — M25511 Pain in right shoulder: Secondary | ICD-10-CM | POA: Diagnosis not present

## 2020-12-08 MED ORDER — CYCLOBENZAPRINE HCL 10 MG PO TABS: 10.0000 mg | ORAL_TABLET | Freq: Three times a day (TID) | ORAL | 0 refills | Status: DC | PRN

## 2020-12-08 NOTE — Progress Notes (Signed)
Rx for Flexeril submitted per PA, [employer] request

## 2020-12-08 NOTE — Telephone Encounter (Signed)
Patient seen in office today. MRI of left knee, left wrist and arthrogram right shoulder suggested by PA. Patient requested hard copies of orders as she has to go to a specific imaging agency due to her insurance. Orders written and provided to patient. She also was notified will need to bring disc of images as we will likely not have access to images where she will be referred. Patient understands
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
## 2020-12-14 ENCOUNTER — Encounter: Payer: Self-pay | Admitting: Surgical

## 2021-01-02 ENCOUNTER — Ambulatory Visit: Payer: BC Managed Care – PPO | Admitting: Orthopedic Surgery

## 2021-01-16 ENCOUNTER — Ambulatory Visit: Payer: BC Managed Care – PPO | Admitting: Orthopedic Surgery

## 2021-02-06 ENCOUNTER — Other Ambulatory Visit: Payer: Self-pay

## 2021-02-06 ENCOUNTER — Telehealth: Payer: Self-pay

## 2021-02-06 ENCOUNTER — Ambulatory Visit (INDEPENDENT_AMBULATORY_CARE_PROVIDER_SITE_OTHER): Payer: BC Managed Care – PPO | Admitting: Sports Medicine

## 2021-02-06 VITALS — BP 122/90 | HR 84 | Ht 64.0 in | Wt 172.0 lb

## 2021-02-06 DIAGNOSIS — F0781 Postconcussional syndrome: Secondary | ICD-10-CM

## 2021-02-06 DIAGNOSIS — S060X9A Concussion with loss of consciousness of unspecified duration, initial encounter: Secondary | ICD-10-CM

## 2021-02-06 MED ORDER — AMANTADINE HCL 100 MG PO CAPS: 100.0000 mg | ORAL_CAPSULE | Freq: Two times a day (BID) | ORAL | 0 refills | Status: DC

## 2021-02-06 NOTE — Patient Instructions (Addendum)
Good see you  Amantadine sent in, take 1 capsule twice a day  Referral for vestibular therapy at [facility] [facility] location. They will call you to schedule.  See me again in 2 week
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
## 2021-02-11 ENCOUNTER — Encounter: Payer: Self-pay | Admitting: Sports Medicine

## 2021-02-20 ENCOUNTER — Ambulatory Visit: Payer: BC Managed Care – PPO | Admitting: Sports Medicine

## 2021-02-25 NOTE — Telephone Encounter (Signed)
Patient seen by Dr. Glennon Mac.

## 2021-02-26 ENCOUNTER — Ambulatory Visit (INDEPENDENT_AMBULATORY_CARE_PROVIDER_SITE_OTHER): Payer: BC Managed Care – PPO

## 2021-02-26 ENCOUNTER — Encounter: Payer: Self-pay | Admitting: Sports Medicine

## 2021-02-26 ENCOUNTER — Other Ambulatory Visit: Payer: Self-pay

## 2021-02-26 ENCOUNTER — Ambulatory Visit (INDEPENDENT_AMBULATORY_CARE_PROVIDER_SITE_OTHER): Payer: BC Managed Care – PPO | Admitting: Sports Medicine

## 2021-02-26 VITALS — BP 130/86 | HR 75 | Ht 64.0 in | Wt 168.0 lb

## 2021-02-26 DIAGNOSIS — S060X9A Concussion with loss of consciousness of unspecified duration, initial encounter: Secondary | ICD-10-CM

## 2021-02-26 DIAGNOSIS — R55 Syncope and collapse: Secondary | ICD-10-CM

## 2021-02-26 DIAGNOSIS — F0781 Postconcussional syndrome: Secondary | ICD-10-CM | POA: Diagnosis not present

## 2021-02-26 DIAGNOSIS — M25561 Pain in right knee: Secondary | ICD-10-CM

## 2021-02-26 MED ORDER — AMANTADINE HCL 100 MG PO CAPS: 100.0000 mg | ORAL_CAPSULE | Freq: Two times a day (BID) | ORAL | 0 refills | Status: DC
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
## 2021-02-26 NOTE — Patient Instructions (Addendum)
Good to see you  Continue with Vestibular therapy  Continue amantadine refill sent Start melatonin 1mg  nightly for sleep if ineffective call and we will prescribe trazodone We will work on get you a heart rate monitor   See me again in 3 weeks

## 2021-02-26 NOTE — Progress Notes (Unsigned)
Enrolled patient for a 7 day Zio XT monitor to be mailed to patients home.  

## 2021-03-01 DIAGNOSIS — R55 Syncope and collapse: Secondary | ICD-10-CM

## 2021-03-05 ENCOUNTER — Other Ambulatory Visit: Payer: Self-pay | Admitting: Sports Medicine

## 2021-03-05 NOTE — Telephone Encounter (Signed)
Refill sent on 02/26/21

## 2021-03-19 ENCOUNTER — Ambulatory Visit: Payer: BC Managed Care – PPO | Admitting: Sports Medicine
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
## 2021-03-20 ENCOUNTER — Other Ambulatory Visit: Payer: Self-pay

## 2021-03-20 ENCOUNTER — Ambulatory Visit (INDEPENDENT_AMBULATORY_CARE_PROVIDER_SITE_OTHER): Payer: BC Managed Care – PPO | Admitting: Sports Medicine

## 2021-03-20 VITALS — BP 110/84 | HR 81 | Ht 64.0 in | Wt 160.0 lb

## 2021-03-20 DIAGNOSIS — S060X9A Concussion with loss of consciousness of unspecified duration, initial encounter: Secondary | ICD-10-CM

## 2021-03-20 DIAGNOSIS — G4700 Insomnia, unspecified: Secondary | ICD-10-CM

## 2021-03-20 DIAGNOSIS — F0781 Postconcussional syndrome: Secondary | ICD-10-CM | POA: Diagnosis not present

## 2021-03-20 MED ORDER — TRAZODONE HCL 50 MG PO TABS: 50.0000 mg | ORAL_TABLET | Freq: Every evening | ORAL | 0 refills | Status: DC | PRN

## 2021-03-20 MED ORDER — TRAZODONE HCL 50 MG PO TABS: 25.0000 mg | ORAL_TABLET | Freq: Every evening | ORAL | 0 refills | Status: DC | PRN

## 2021-03-20 NOTE — Patient Instructions (Addendum)
See me again in 2 weeks Complete course of amantadine Rx trazadone 50mg  at night, double up to 100mg  at night if ineffective Continue vestibular therapy

## 2021-04-01 ENCOUNTER — Other Ambulatory Visit: Payer: Self-pay | Admitting: Sports Medicine

## 2021-04-02 ENCOUNTER — Ambulatory Visit: Payer: BC Managed Care – PPO | Admitting: Sports Medicine

## 2021-04-07 ENCOUNTER — Ambulatory Visit: Payer: BC Managed Care – PPO | Admitting: Sports Medicine
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
## 2021-04-08 ENCOUNTER — Ambulatory Visit: Payer: BC Managed Care – PPO | Admitting: Sports Medicine

## 2021-05-11 ENCOUNTER — Other Ambulatory Visit: Payer: Self-pay | Admitting: Sports Medicine

## 2021-05-11 DIAGNOSIS — F0781 Postconcussional syndrome: Secondary | ICD-10-CM

## 2021-05-11 DIAGNOSIS — S060X9A Concussion with loss of consciousness of unspecified duration, initial encounter: Secondary | ICD-10-CM

## 2021-05-11 DIAGNOSIS — G4700 Insomnia, unspecified: Secondary | ICD-10-CM
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
## 2021-06-04 ENCOUNTER — Ambulatory Visit: Payer: BC Managed Care – PPO | Admitting: Sports Medicine
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
## 2021-06-11 ENCOUNTER — Other Ambulatory Visit: Payer: Self-pay

## 2021-06-11 ENCOUNTER — Ambulatory Visit: Payer: BC Managed Care – PPO | Admitting: Sports Medicine

## 2021-06-11 VITALS — BP 118/70 | HR 77 | Ht 64.0 in | Wt 150.0 lb

## 2021-06-11 DIAGNOSIS — F0781 Postconcussional syndrome: Secondary | ICD-10-CM

## 2021-06-11 DIAGNOSIS — S060X9D Concussion with loss of consciousness of unspecified duration, subsequent encounter: Secondary | ICD-10-CM | POA: Diagnosis not present

## 2021-06-11 DIAGNOSIS — G4700 Insomnia, unspecified: Secondary | ICD-10-CM

## 2021-06-11 DIAGNOSIS — R4189 Other symptoms and signs involving cognitive functions and awareness: Secondary | ICD-10-CM | POA: Diagnosis not present

## 2021-06-11 DIAGNOSIS — R270 Ataxia, unspecified: Secondary | ICD-10-CM

## 2021-06-11 NOTE — Patient Instructions (Addendum)
Good to see you  Referral for brain MRI Continue vestibular therapy  Follow up 1 week after MRI is preformed with disc and results to review and discuss findings
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
## 2021-06-30 ENCOUNTER — Other Ambulatory Visit: Payer: Self-pay

## 2021-06-30 ENCOUNTER — Ambulatory Visit: Payer: BC Managed Care – PPO | Admitting: Sports Medicine

## 2021-06-30 VITALS — BP 120/80 | HR 80 | Ht 64.0 in | Wt 149.0 lb

## 2021-06-30 DIAGNOSIS — R270 Ataxia, unspecified: Secondary | ICD-10-CM | POA: Diagnosis not present

## 2021-06-30 DIAGNOSIS — F0781 Postconcussional syndrome: Secondary | ICD-10-CM

## 2021-06-30 DIAGNOSIS — G4700 Insomnia, unspecified: Secondary | ICD-10-CM

## 2021-06-30 DIAGNOSIS — S060X9D Concussion with loss of consciousness of unspecified duration, subsequent encounter: Secondary | ICD-10-CM

## 2021-06-30 DIAGNOSIS — R4189 Other symptoms and signs involving cognitive functions and awareness: Secondary | ICD-10-CM

## 2021-06-30 MED ORDER — AMITRIPTYLINE HCL 25 MG PO TABS: 25.0000 mg | ORAL_TABLET | Freq: Every day | ORAL | 0 refills | Status: DC

## 2021-06-30 MED ORDER — AMITRIPTYLINE HCL 10 MG PO TABS: 10.0000 mg | ORAL_TABLET | Freq: Every day | ORAL | 0 refills | Status: DC

## 2021-06-30 NOTE — Patient Instructions (Addendum)
Good to see you  Amitriptyline 10 mg a day for 1 week if negative side effects discontinue the medication if no negative side effects then finish 10 mg tablets then start 25 mg tablets  2 week follow up
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
## 2021-07-14 ENCOUNTER — Ambulatory Visit: Payer: BC Managed Care – PPO | Admitting: Sports Medicine

## 2021-08-17 ENCOUNTER — Other Ambulatory Visit: Payer: Self-pay | Admitting: Sports Medicine

## 2021-08-17 ENCOUNTER — Encounter: Payer: Self-pay | Admitting: Sports Medicine

## 2021-08-17 DIAGNOSIS — F0781 Postconcussional syndrome: Secondary | ICD-10-CM

## 2021-08-17 NOTE — Telephone Encounter (Signed)
Pt was called and messaged on mychart and notified that a referral for neurology has been sent in  ?

## 2021-10-06 ENCOUNTER — Telehealth: Payer: Self-pay | Admitting: Sports Medicine

## 2021-10-06 NOTE — Telephone Encounter (Signed)
I have faxed referral to doctor and fax number below ? ?

## 2021-10-06 NOTE — Telephone Encounter (Signed)
Patient called asking if the neurology referral could be sent to Dr Hester Mates at fax # [phone]. ? ?Please advise. ? ?

## 2021-11-11 ENCOUNTER — Encounter: Payer: Self-pay | Admitting: Sports Medicine

## 2021-11-24 ENCOUNTER — Other Ambulatory Visit: Payer: Self-pay | Admitting: Sports Medicine

## 2021-11-24 DIAGNOSIS — S060X9D Concussion with loss of consciousness of unspecified duration, subsequent encounter: Secondary | ICD-10-CM

## 2021-11-24 DIAGNOSIS — F0781 Postconcussional syndrome: Secondary | ICD-10-CM

## 2021-11-24 DIAGNOSIS — R55 Syncope and collapse: Secondary | ICD-10-CM

## 2021-11-24 DIAGNOSIS — S060X9A Concussion with loss of consciousness of unspecified duration, initial encounter: Secondary | ICD-10-CM

## 2021-11-24 DIAGNOSIS — R270 Ataxia, unspecified: Secondary | ICD-10-CM

## 2021-11-24 DIAGNOSIS — G4700 Insomnia, unspecified: Secondary | ICD-10-CM

## 2021-11-24 DIAGNOSIS — R4189 Other symptoms and signs involving cognitive functions and awareness: Secondary | ICD-10-CM

## 2021-11-24 NOTE — Telephone Encounter (Signed)
Pt was called and spoke with husband letting them know that referral for neuropsychology had been placed with Lexine Baton

## 2021-12-10 ENCOUNTER — Encounter: Payer: Self-pay | Admitting: Adult Health

## 2021-12-10 ENCOUNTER — Ambulatory Visit (INDEPENDENT_AMBULATORY_CARE_PROVIDER_SITE_OTHER): Payer: BC Managed Care – PPO | Admitting: Adult Health

## 2021-12-10 VITALS — BP 138/86 | HR 77 | Ht 64.0 in | Wt 143.0 lb

## 2021-12-10 DIAGNOSIS — F4310 Post-traumatic stress disorder, unspecified: Secondary | ICD-10-CM

## 2021-12-10 DIAGNOSIS — F411 Generalized anxiety disorder: Secondary | ICD-10-CM | POA: Diagnosis not present

## 2021-12-10 DIAGNOSIS — F410 Panic disorder [episodic paroxysmal anxiety] without agoraphobia: Secondary | ICD-10-CM | POA: Diagnosis not present

## 2021-12-10 DIAGNOSIS — G4700 Insomnia, unspecified: Secondary | ICD-10-CM

## 2021-12-10 DIAGNOSIS — F331 Major depressive disorder, recurrent, moderate: Secondary | ICD-10-CM | POA: Diagnosis not present

## 2021-12-10 MED ORDER — VENLAFAXINE HCL ER 37.5 MG PO CP24: ORAL_CAPSULE | ORAL | 2 refills | Status: DC

## 2021-12-10 MED ORDER — FLUOXETINE HCL 10 MG PO CAPS: 10.0000 mg | ORAL_CAPSULE | Freq: Every day | ORAL | 5 refills | Status: DC

## 2021-12-10 MED ORDER — LORAZEPAM 0.5 MG PO TABS: 0.5000 mg | ORAL_TABLET | Freq: Two times a day (BID) | ORAL | 2 refills | Status: DC
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
## 2021-12-28 ENCOUNTER — Telehealth: Payer: Self-pay | Admitting: Adult Health

## 2021-12-28 NOTE — Telephone Encounter (Signed)
LVM to RC 

## 2021-12-28 NOTE — Telephone Encounter (Signed)
We can d/c prozac and add cymbalta '30mg'$  daily.

## 2021-12-28 NOTE — Telephone Encounter (Signed)
Patient called and said she had spoken to her spine/scoliosis MD. She said she has a lot of nerve pain and he thought amitriptyline, nortriptyline, or duloxetine might be better for her than fluoxetine.  She said she had not started the fluoxetine yet. Please advise.

## 2021-12-28 NOTE — Telephone Encounter (Signed)
Dorina called this morning at 10:50 and LM that her Dr. Was reviewing her medication and had some questions about them.  He is suggesting a possible medication change.  Please call her to discuss.  Next appt 8/24

## 2021-12-30 ENCOUNTER — Other Ambulatory Visit: Payer: Self-pay

## 2021-12-30 MED ORDER — DULOXETINE HCL 20 MG PO CPEP: 20.0000 mg | ORAL_CAPSULE | Freq: Every day | ORAL | 0 refills | Status: DC

## 2021-12-30 NOTE — Telephone Encounter (Signed)
Rx for 20 mg sent to requested pharmacy. Patient notified.

## 2022-01-04 ENCOUNTER — Other Ambulatory Visit: Payer: Self-pay | Admitting: Adult Health

## 2022-01-04 DIAGNOSIS — F331 Major depressive disorder, recurrent, moderate: Secondary | ICD-10-CM

## 2022-01-04 DIAGNOSIS — F411 Generalized anxiety disorder: Secondary | ICD-10-CM

## 2022-01-04 DIAGNOSIS — G4700 Insomnia, unspecified: Secondary | ICD-10-CM

## 2022-01-04 DIAGNOSIS — F410 Panic disorder [episodic paroxysmal anxiety] without agoraphobia: Secondary | ICD-10-CM

## 2022-01-04 DIAGNOSIS — F4310 Post-traumatic stress disorder, unspecified: Secondary | ICD-10-CM

## 2022-01-21 ENCOUNTER — Other Ambulatory Visit: Payer: Self-pay | Admitting: Adult Health

## 2022-01-21 ENCOUNTER — Ambulatory Visit: Payer: BC Managed Care – PPO | Admitting: Adult Health

## 2022-01-21 NOTE — Telephone Encounter (Signed)
Please schedule appt

## 2022-01-27 NOTE — Telephone Encounter (Signed)
Lvm for pt to call back and schedule.  

## 2022-02-05 ENCOUNTER — Ambulatory Visit: Payer: BC Managed Care – PPO | Admitting: Adult Health

## 2022-02-05 ENCOUNTER — Encounter: Payer: Self-pay | Admitting: Adult Health

## 2022-02-05 DIAGNOSIS — F331 Major depressive disorder, recurrent, moderate: Secondary | ICD-10-CM | POA: Diagnosis not present

## 2022-02-05 DIAGNOSIS — F4310 Post-traumatic stress disorder, unspecified: Secondary | ICD-10-CM

## 2022-02-05 DIAGNOSIS — F410 Panic disorder [episodic paroxysmal anxiety] without agoraphobia: Secondary | ICD-10-CM

## 2022-02-05 DIAGNOSIS — F411 Generalized anxiety disorder: Secondary | ICD-10-CM | POA: Diagnosis not present

## 2022-02-05 DIAGNOSIS — G4700 Insomnia, unspecified: Secondary | ICD-10-CM

## 2022-02-05 MED ORDER — DULOXETINE HCL 30 MG PO CPEP: 30.0000 mg | ORAL_CAPSULE | Freq: Every day | ORAL | 1 refills | Status: DC
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
## 2022-05-06 ENCOUNTER — Telehealth (INDEPENDENT_AMBULATORY_CARE_PROVIDER_SITE_OTHER): Payer: BC Managed Care – PPO | Admitting: Adult Health

## 2022-05-06 ENCOUNTER — Encounter: Payer: Self-pay | Admitting: Adult Health

## 2022-05-06 DIAGNOSIS — F411 Generalized anxiety disorder: Secondary | ICD-10-CM

## 2022-05-06 DIAGNOSIS — F4310 Post-traumatic stress disorder, unspecified: Secondary | ICD-10-CM | POA: Diagnosis not present

## 2022-05-06 DIAGNOSIS — F331 Major depressive disorder, recurrent, moderate: Secondary | ICD-10-CM

## 2022-05-06 DIAGNOSIS — F410 Panic disorder [episodic paroxysmal anxiety] without agoraphobia: Secondary | ICD-10-CM | POA: Diagnosis not present

## 2022-05-06 MED ORDER — DULOXETINE HCL 40 MG PO CPEP: 40.0000 mg | ORAL_CAPSULE | Freq: Every day | ORAL | 1 refills | Status: DC
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
## 2022-07-16 ENCOUNTER — Telehealth (INDEPENDENT_AMBULATORY_CARE_PROVIDER_SITE_OTHER): Payer: BC Managed Care – PPO | Admitting: Adult Health

## 2022-07-16 ENCOUNTER — Encounter: Payer: Self-pay | Admitting: Adult Health

## 2022-07-16 DIAGNOSIS — F411 Generalized anxiety disorder: Secondary | ICD-10-CM | POA: Diagnosis not present

## 2022-07-16 DIAGNOSIS — F4310 Post-traumatic stress disorder, unspecified: Secondary | ICD-10-CM | POA: Diagnosis not present

## 2022-07-16 DIAGNOSIS — F331 Major depressive disorder, recurrent, moderate: Secondary | ICD-10-CM | POA: Diagnosis not present

## 2022-07-16 DIAGNOSIS — G4700 Insomnia, unspecified: Secondary | ICD-10-CM

## 2022-07-16 MED ORDER — QUETIAPINE FUMARATE 25 MG PO TABS: ORAL_TABLET | ORAL | 2 refills | Status: DC

## 2022-07-16 MED ORDER — DULOXETINE HCL 60 MG PO CPEP: 60.0000 mg | ORAL_CAPSULE | Freq: Every day | ORAL | 2 refills | Status: DC
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
## 2022-08-08 ENCOUNTER — Other Ambulatory Visit: Payer: Self-pay | Admitting: Adult Health

## 2022-08-08 DIAGNOSIS — F331 Major depressive disorder, recurrent, moderate: Secondary | ICD-10-CM

## 2022-08-08 DIAGNOSIS — F4310 Post-traumatic stress disorder, unspecified: Secondary | ICD-10-CM

## 2022-08-08 DIAGNOSIS — F411 Generalized anxiety disorder: Secondary | ICD-10-CM

## 2022-11-09 ENCOUNTER — Other Ambulatory Visit: Payer: Self-pay | Admitting: Adult Health

## 2022-11-09 DIAGNOSIS — F4310 Post-traumatic stress disorder, unspecified: Secondary | ICD-10-CM

## 2022-11-09 DIAGNOSIS — F411 Generalized anxiety disorder: Secondary | ICD-10-CM

## 2022-11-09 DIAGNOSIS — F331 Major depressive disorder, recurrent, moderate: Secondary | ICD-10-CM

## 2022-11-10 NOTE — Telephone Encounter (Signed)
Pt was due back in April

## 2022-11-11 NOTE — Telephone Encounter (Signed)
Apt 6/18

## 2022-11-13 ENCOUNTER — Other Ambulatory Visit: Payer: Self-pay | Admitting: Adult Health

## 2022-11-13 DIAGNOSIS — F4310 Post-traumatic stress disorder, unspecified: Secondary | ICD-10-CM

## 2022-11-13 DIAGNOSIS — F411 Generalized anxiety disorder: Secondary | ICD-10-CM

## 2022-11-13 DIAGNOSIS — F331 Major depressive disorder, recurrent, moderate: Secondary | ICD-10-CM

## 2022-11-14 NOTE — Telephone Encounter (Signed)
Appt 6/18

## 2022-11-16 ENCOUNTER — Encounter: Payer: Self-pay | Admitting: Adult Health

## 2022-11-16 ENCOUNTER — Ambulatory Visit (INDEPENDENT_AMBULATORY_CARE_PROVIDER_SITE_OTHER): Payer: BC Managed Care – PPO | Admitting: Adult Health

## 2022-11-16 DIAGNOSIS — F331 Major depressive disorder, recurrent, moderate: Secondary | ICD-10-CM | POA: Diagnosis not present

## 2022-11-16 DIAGNOSIS — F411 Generalized anxiety disorder: Secondary | ICD-10-CM

## 2022-11-16 DIAGNOSIS — F4310 Post-traumatic stress disorder, unspecified: Secondary | ICD-10-CM | POA: Diagnosis not present

## 2022-11-16 DIAGNOSIS — F410 Panic disorder [episodic paroxysmal anxiety] without agoraphobia: Secondary | ICD-10-CM

## 2022-11-16 DIAGNOSIS — F902 Attention-deficit hyperactivity disorder, combined type: Secondary | ICD-10-CM

## 2022-11-16 DIAGNOSIS — G4700 Insomnia, unspecified: Secondary | ICD-10-CM

## 2022-11-16 MED ORDER — DULOXETINE HCL 60 MG PO CPEP: 60.0000 mg | ORAL_CAPSULE | Freq: Every day | ORAL | 3 refills | Status: AC

## 2022-11-16 MED ORDER — LISDEXAMFETAMINE DIMESYLATE 40 MG PO CAPS: 40.0000 mg | ORAL_CAPSULE | ORAL | 0 refills | Status: DC
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
## 2022-12-06 ENCOUNTER — Other Ambulatory Visit: Payer: Self-pay | Admitting: Adult Health

## 2022-12-06 DIAGNOSIS — F411 Generalized anxiety disorder: Secondary | ICD-10-CM

## 2022-12-06 DIAGNOSIS — F331 Major depressive disorder, recurrent, moderate: Secondary | ICD-10-CM

## 2022-12-06 DIAGNOSIS — F4310 Post-traumatic stress disorder, unspecified: Secondary | ICD-10-CM

## 2022-12-10 ENCOUNTER — Other Ambulatory Visit: Payer: Self-pay | Admitting: Adult Health

## 2022-12-10 DIAGNOSIS — F411 Generalized anxiety disorder: Secondary | ICD-10-CM

## 2022-12-10 DIAGNOSIS — F4310 Post-traumatic stress disorder, unspecified: Secondary | ICD-10-CM

## 2022-12-10 DIAGNOSIS — F331 Major depressive disorder, recurrent, moderate: Secondary | ICD-10-CM

## 2022-12-14 ENCOUNTER — Telehealth (INDEPENDENT_AMBULATORY_CARE_PROVIDER_SITE_OTHER): Payer: BC Managed Care – PPO | Admitting: Adult Health

## 2022-12-14 ENCOUNTER — Encounter: Payer: Self-pay | Admitting: Adult Health

## 2022-12-14 ENCOUNTER — Other Ambulatory Visit: Payer: Self-pay | Admitting: Adult Health

## 2022-12-14 DIAGNOSIS — F411 Generalized anxiety disorder: Secondary | ICD-10-CM

## 2022-12-14 DIAGNOSIS — F410 Panic disorder [episodic paroxysmal anxiety] without agoraphobia: Secondary | ICD-10-CM

## 2022-12-14 DIAGNOSIS — F4310 Post-traumatic stress disorder, unspecified: Secondary | ICD-10-CM

## 2022-12-14 DIAGNOSIS — F902 Attention-deficit hyperactivity disorder, combined type: Secondary | ICD-10-CM | POA: Diagnosis not present

## 2022-12-14 DIAGNOSIS — F331 Major depressive disorder, recurrent, moderate: Secondary | ICD-10-CM

## 2022-12-14 MED ORDER — LISDEXAMFETAMINE DIMESYLATE 50 MG PO CAPS
50.0000 mg | ORAL_CAPSULE | ORAL | 0 refills | Status: DC
Start: 2022-12-14 — End: 2023-02-03
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
## 2023-02-03 ENCOUNTER — Telehealth (INDEPENDENT_AMBULATORY_CARE_PROVIDER_SITE_OTHER): Payer: BC Managed Care – PPO | Admitting: Adult Health

## 2023-02-03 ENCOUNTER — Encounter: Payer: Self-pay | Admitting: Adult Health

## 2023-02-03 DIAGNOSIS — F4310 Post-traumatic stress disorder, unspecified: Secondary | ICD-10-CM | POA: Diagnosis not present

## 2023-02-03 DIAGNOSIS — F902 Attention-deficit hyperactivity disorder, combined type: Secondary | ICD-10-CM | POA: Diagnosis not present

## 2023-02-03 DIAGNOSIS — F331 Major depressive disorder, recurrent, moderate: Secondary | ICD-10-CM | POA: Diagnosis not present

## 2023-02-03 DIAGNOSIS — F411 Generalized anxiety disorder: Secondary | ICD-10-CM

## 2023-02-03 MED ORDER — LISDEXAMFETAMINE DIMESYLATE 60 MG PO CAPS
60.0000 mg | ORAL_CAPSULE | ORAL | 0 refills | Status: AC
Start: 2023-02-03 — End: ?
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
## 2023-02-28 ENCOUNTER — Other Ambulatory Visit: Payer: Self-pay | Admitting: Adult Health

## 2023-02-28 DIAGNOSIS — F4310 Post-traumatic stress disorder, unspecified: Secondary | ICD-10-CM
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
## 2023-06-21 ENCOUNTER — Emergency Department (HOSPITAL_BASED_OUTPATIENT_CLINIC_OR_DEPARTMENT_OTHER)
Admission: EM | Admit: 2023-06-21 | Discharge: 2023-06-21 | Disposition: A | Discharge status: Home / Self Care | Payer: BC Managed Care – PPO | Attending: Emergency Medicine | Admitting: Emergency Medicine

## 2023-06-21 ENCOUNTER — Other Ambulatory Visit: Payer: Self-pay

## 2023-06-21 ENCOUNTER — Encounter (HOSPITAL_BASED_OUTPATIENT_CLINIC_OR_DEPARTMENT_OTHER): Payer: Self-pay | Admitting: Emergency Medicine

## 2023-06-21 DIAGNOSIS — J45909 Unspecified asthma, uncomplicated: Secondary | ICD-10-CM | POA: Diagnosis not present

## 2023-06-21 DIAGNOSIS — M7989 Other specified soft tissue disorders: Secondary | ICD-10-CM | POA: Insufficient documentation

## 2023-06-21 DIAGNOSIS — L299 Pruritus, unspecified: Secondary | ICD-10-CM | POA: Insufficient documentation

## 2023-06-21 DIAGNOSIS — T50905A Adverse effect of unspecified drugs, medicaments and biological substances, initial encounter: Secondary | ICD-10-CM

## 2023-06-21 LAB — COMPREHENSIVE METABOLIC PANEL
ALT: 9 U/L (ref 0–44)
AST: 12 U/L — ABNORMAL LOW (ref 15–41)
Albumin: 4.2 g/dL (ref 3.5–5.0)
Alkaline Phosphatase: 49 U/L (ref 38–126)
Anion gap: 7 (ref 5–15)
BUN: 12 mg/dL (ref 6–20)
CO2: 30 mmol/L (ref 22–32)
Calcium: 8.9 mg/dL (ref 8.9–10.3)
Chloride: 99 mmol/L (ref 98–111)
Creatinine, Ser: 0.65 mg/dL (ref 0.44–1.00)
GFR, Estimated: >60 mL/min (ref >60)
Glucose, Bld: 82 mg/dL (ref 70–99)
Potassium: 3.1 mmol/L — ABNORMAL LOW (ref 3.5–5.1)
Sodium: 136 mmol/L (ref 135–145)
Total Bilirubin: 0.8 mg/dL (ref 0.0–1.2)
Total Protein: 6.8 g/dL (ref 6.5–8.1)

## 2023-06-21 LAB — PROTIME-INR
INR: 1 (ref 0.8–1.2)
Prothrombin Time: 13.3 s (ref 11.4–15.2)

## 2023-06-21 LAB — CBC WITH DIFFERENTIAL/PLATELET
Abs Immature Granulocytes: 0.05 10*3/uL (ref 0.00–0.07)
Basophils Absolute: 0 10*3/uL (ref 0.0–0.1)
Basophils Relative: 0 %
Eosinophils Absolute: 0 10*3/uL (ref 0.0–0.5)
Eosinophils Relative: 0 %
HCT: 41.2 % (ref 36.0–46.0)
Hemoglobin: 14 g/dL (ref 12.0–15.0)
Immature Granulocytes: 0 %
Lymphocytes Relative: 11 %
Lymphs Abs: 1.5 10*3/uL (ref 0.7–4.0)
MCH: 27.9 pg (ref 26.0–34.0)
MCHC: 34 g/dL (ref 30.0–36.0)
MCV: 82.1 fL (ref 80.0–100.0)
Monocytes Absolute: 0.5 10*3/uL (ref 0.1–1.0)
Monocytes Relative: 4 %
Neutro Abs: 11.2 10*3/uL — ABNORMAL HIGH (ref 1.7–7.7)
Neutrophils Relative %: 85 %
Platelets: 360 10*3/uL (ref 150–400)
RBC: 5.02 MIL/uL (ref 3.87–5.11)
RDW: 14 % (ref 11.5–15.5)
WBC: 12.8 10*3/uL — ABNORMAL HIGH (ref 4.0–10.5)
nRBC: 0 % (ref 0.0–0.2)

## 2023-06-21 LAB — C-REACTIVE PROTEIN: CRP: 5.1 mg/dL — ABNORMAL HIGH (ref <1.0)

## 2023-06-21 LAB — SEDIMENTATION RATE: Sed Rate: 25 mm/h — ABNORMAL HIGH (ref 0–22)

## 2023-06-21 MED ORDER — DIPHENHYDRAMINE HCL 50 MG/ML IJ SOLN
12.5000 mg | Freq: Once | INTRAMUSCULAR | Status: AC
  Administered 2023-06-21: 12.5 mg via INTRAVENOUS
  Filled 2023-06-21: qty 1

## 2023-06-21 MED ORDER — HYDROXYZINE HCL 25 MG PO TABS: 25.0000 mg | ORAL_TABLET | Freq: Four times a day (QID) | ORAL | 0 refills | Status: AC | PRN

## 2023-06-21 MED ORDER — METHYLPREDNISOLONE 4 MG PO TBPK: ORAL_TABLET | ORAL | 0 refills | Status: AC

## 2023-06-21 MED ORDER — METHYLPREDNISOLONE SODIUM SUCC 125 MG IJ SOLR
125.0000 mg | Freq: Once | INTRAMUSCULAR | Status: AC
  Administered 2023-06-21: 125 mg via INTRAVENOUS
  Filled 2023-06-21: qty 2

## 2023-06-21 MED ORDER — TRIAMCINOLONE ACETONIDE 0.1 % EX CREA: 1.0000 | TOPICAL_CREAM | Freq: Two times a day (BID) | CUTANEOUS | 0 refills | Status: AC | PRN

## 2023-06-21 MED ORDER — FAMOTIDINE IN NACL 20-0.9 MG/50ML-% IV SOLN
20.0000 mg | Freq: Once | INTRAVENOUS | Status: AC
  Administered 2023-06-21: 20 mg via INTRAVENOUS
  Filled 2023-06-21: qty 50

## 2023-06-21 NOTE — ED Provider Notes (Signed)
[place] EMERGENCY DEPARTMENT AT [facility] Provider Note   CSN: [id number] Arrival date & time: 06/21/23  1308     History  Chief Complaint  Patient presents with   Allergic Reaction    [name] is a 38 y.o. female who presents with rash. She woke up with it 2 days ago . She reports diffuse itching and pain. She denies sob, n/v/loc. She has been taking pepcid and benadryl with and states that her symptoms initially improved but now they do not seem to be working to reduce her symptoms.Marland Kitchen Hx of childhood asthma. No known allergies.  She does report starting a new medication, Ingrezza for tardive dyskinesia.  She began taking 40 mg about 10 days ago, ran out missed 4 days and then began taking 60 mg 4 days prior to the onset of her rash.  She is also complaining of pain and swelling in her distal forearms legs palms and soles of her feet.  Allergic Reaction      Home Medications Prior to Admission medications   Medication Sig Start Date End Date Taking? Authorizing Provider  baclofen (LIORESAL) 10 MG tablet TAKE 1 TABLET BY MOUTH 3 TIMES EVERY DAY AS NEEDED FOR MUSCLE SPASMS 06/11/21   [provider]  cetirizine (ZYRTEC) 10 MG tablet Take 10 mg by mouth daily.    [provider]  Cholecalciferol 100 MCG (4000 UT) CAPS Take 4,000 Units by mouth daily.    [provider]  cyanocobalamin (,VITAMIN B-12,) 1000 MCG/ML injection Inject 1,000 mcg into the muscle See admin instructions. Every 30-45 days 04/01/15   [provider]  DULoxetine (CYMBALTA) 60 MG capsule Take 1 capsule (60 mg total) by mouth daily. 11/16/22   Mozingo, Thereasa Solo, NP  lisdexamfetamine (VYVANSE) 60 MG capsule Take 1 capsule (60 mg total) by mouth every morning. 02/03/23   Mozingo, Thereasa Solo, NP  lisinopril (ZESTRIL) 5 MG tablet Take 10 mg by mouth daily.    [provider]  LORazepam (ATIVAN) 0.5 MG tablet TAKE 1 TABLET BY MOUTH 2 TIMES DAILY.  03/01/23   Mozingo, Thereasa Solo, NP  ondansetron (ZOFRAN) 8 MG tablet Take 4-8 mg by mouth every 8 (eight) hours as needed for nausea or vomiting. 03/18/14   [provider]  pregabalin (LYRICA) 150 MG capsule Take 150 mg by mouth 2 (two) times daily.    [provider]      Allergies    Aspartame and Ibuprofen    Review of Systems   Review of Systems  Physical Exam Updated Vital Signs BP (!) 151/108   Pulse 97   Temp 98.8 F (37.1 C)   Resp 16   Ht 5\' 4"  (1.626 m)   Wt 70.3 kg   SpO2 100%   BMI 26.61 kg/m  Physical Exam Vitals and nursing note reviewed.  Constitutional:      General: She is not in acute distress.    Appearance: She is well-developed. She is not diaphoretic.  HENT:     Head: Normocephalic and atraumatic.     Right Ear: External ear normal.     Left Ear: External ear normal.     Nose: Nose normal.     Mouth/Throat:     Mouth: Mucous membranes are moist.  Eyes:     General: No scleral icterus.    Conjunctiva/sclera: Conjunctivae normal.  Cardiovascular:     Rate and Rhythm: Normal rate and regular rhythm.     Heart sounds: Normal  heart sounds. No murmur heard.    No friction rub. No gallop.  Pulmonary:     Effort: Pulmonary effort is normal. No respiratory distress.     Breath sounds: Normal breath sounds.  Abdominal:     General: Bowel sounds are normal. There is no distension.     Palpations: Abdomen is soft. There is no mass.     Tenderness: There is no abdominal tenderness. There is no guarding.  Musculoskeletal:     Cervical back: Normal range of motion.  Skin:    General: Skin is warm and dry.     Comments: Diffuse raised confluent wheals that are erythematous involving the face, neck, arms, chest legs and feet.  There is some erythematous regions over the palms and feet which are warm to the touch.  These are also tender to palpation.  There is a circular region of urticaria over the left forearm.  Patient has no  desquamation bullae or petechial rashes.  Neurological:     Mental Status: She is alert and oriented to person, place, and time.  Psychiatric:        Behavior: Behavior normal.     ED Results / Procedures / Treatments   Labs (all labs ordered are listed, but only abnormal results are displayed) Labs Reviewed - No data to display  EKG None  Radiology No results found.  Procedures Procedures    Medications Ordered in ED Medications - No data to display  ED Course/ Medical Decision Making/ A&P Clinical Course as of 06/21/23 1532  Tue Jun 21, 2023  1144 WBC(!): 12.8 [AH]  1315 Potassium(!): 3.1 [AH]    Clinical Course User Index [AH] Arthor Captain, PA-C                                 Medical Decision Making Amount and/or Complexity of Data Reviewed Labs: ordered. Decision-making details documented in ED Course. ECG/medicine tests: ordered.  Risk Prescription drug management.   Considering alternative and potentially life-threatening etiologies of rash:   No  pustules, involvement of skin folds or fever to suggest acute generalized exanthematous pustulosis (AGEP, commonly occuring within hours to days of new drug exposure).    No wheezing, throat or oropharyngeal tightness/swelling, lightheadedness, hypotension/tachycardia, or recent exposure to suggest anaphylaxis    No oropharyngeal swelling, edema, drooling, or voice change to suggest angioedema    No systemic symptoms including fever,  arthritis, hematuria, abdominal pain or symptoms of visceral inflammation to suggest Drug Reaction wit Eosinophilia and Systemic Symptoms or DRESS (commonly seen 2-6 weeks after new drug exposure)    No diffuse erythematous rash involving more than 90% TBSA, history of psoriasis or atopic dermatitis, new drug exposure or exam findings (hyperthermia, tachycardia, peripheral edema or generalized lymphadenopathy) to suggest erythroderma    No fever, other systemic  symptoms, erythema, warmth, focal tenderness, crepitance or pain out of proportion to exam to suggest significant cellulitis including necrotizing fasciitis    No blisters or bullae to suggest pemphigoid or pemphigus diseases.   No involvement of mucosal surfaces, no painful rash, no skin breakdown with pressure to suggest Steven's Johnson's Syndrome or toxic epidermal necrolysis   No headache, myalgias, laboratory abnormalities or history of recent tick bites to suggest RMSF   No fever, petechiae, hemodynamic instability, toxic appearance, evidence of end organ involvement or limb ischemia to suggest serious systemic infectious cause including bacteremia, septic emboli or meningococcemia  No petechiae, palpable purpura or arthritis, to suggest vasculitits.  After review of all data point patient does not appear to have SJS, TENS, DRESS. Suspect drug reaction with hives.  Patient treated with steroids with some improvement in her erythema and urticarial irruption.  Will discharge on steroid taper, Kenalog cream and Atarax.  Follow-up with dermatology and PCP appropriate for discharge at this time patient also instructed to discontinue medications.         Final Clinical Impression(s) / ED Diagnoses Final diagnoses:  None    Rx / DC Orders ED Discharge Orders     None         Arthor Captain, PA-C 06/21/23 1543    Rolan Bucco, MD 06/22/23 [phone]

## 2023-06-21 NOTE — Discharge Instructions (Signed)
Contact a health care provider if: Your rash isn't going away or it gets worse. Your skin starts to blister and peel. Your rash comes back. You have a fever. You feel dizzy. Get help right away if: You have chest pain. You have trouble breathing. You make high-pitched whistling sounds when you breathe. This is called wheezing. Your face or throat starts to swell. You faint. These symptoms may be an emergency. Call 911 right away. Do not wait to see if the symptoms will go away. Do not drive yourself to the hospital.

## 2023-06-21 NOTE — ED Triage Notes (Signed)
Pt caox4, ambulatory c/o hives and itching that started at approx 2a Sunday morning, has been taking pepcid and benadryl alternating since, last had meds at 0400 this morning. Pt denies SOB, lung sounds clear and equal. Denies N/V/D. Denies PMH allergic reactions.

## 2023-06-21 NOTE — ED Notes (Signed)
RT Note: Patient doesn't appear to be in any distress at this time. Tolerating room air with an oxygen saturation 100%
